# Patient Record
Sex: Female | Born: 1935 | Race: White | Hispanic: No | Smoking: Never smoker
Health system: Southern US, Community
[De-identification: ages and names within clinical notes are randomized; demographics above are authoritative.]

## PROBLEM LIST (undated history)

## (undated) DIAGNOSIS — E119 Type 2 diabetes mellitus without complications: Secondary | ICD-10-CM

## (undated) DIAGNOSIS — I639 Cerebral infarction, unspecified: Secondary | ICD-10-CM

## (undated) DIAGNOSIS — M1990 Unspecified osteoarthritis, unspecified site: Secondary | ICD-10-CM

## (undated) HISTORY — PX: CHOLECYSTECTOMY: SHX55

## (undated) HISTORY — PX: ABDOMINAL HYSTERECTOMY: SHX81

---

## 2014-07-26 ENCOUNTER — Emergency Department (HOSPITAL_BASED_OUTPATIENT_CLINIC_OR_DEPARTMENT_OTHER)
Admission: EM | Admit: 2014-07-26 | Discharge: 2014-07-26 | Disposition: A | Discharge status: Home / Self Care | Payer: Medicare Other | Attending: Emergency Medicine | Admitting: Emergency Medicine

## 2014-07-26 ENCOUNTER — Encounter (HOSPITAL_BASED_OUTPATIENT_CLINIC_OR_DEPARTMENT_OTHER): Payer: Self-pay | Admitting: *Deleted

## 2014-07-26 ENCOUNTER — Emergency Department (HOSPITAL_BASED_OUTPATIENT_CLINIC_OR_DEPARTMENT_OTHER): Payer: Medicare Other

## 2014-07-26 DIAGNOSIS — R42 Dizziness and giddiness: Secondary | ICD-10-CM | POA: Diagnosis not present

## 2014-07-26 DIAGNOSIS — Z7902 Long term (current) use of antithrombotics/antiplatelets: Secondary | ICD-10-CM | POA: Diagnosis not present

## 2014-07-26 DIAGNOSIS — R5383 Other fatigue: Secondary | ICD-10-CM | POA: Diagnosis not present

## 2014-07-26 DIAGNOSIS — R410 Disorientation, unspecified: Secondary | ICD-10-CM | POA: Diagnosis not present

## 2014-07-26 DIAGNOSIS — R531 Weakness: Secondary | ICD-10-CM | POA: Diagnosis not present

## 2014-07-26 DIAGNOSIS — R112 Nausea with vomiting, unspecified: Secondary | ICD-10-CM | POA: Diagnosis not present

## 2014-07-26 DIAGNOSIS — R51 Headache: Secondary | ICD-10-CM | POA: Diagnosis not present

## 2014-07-26 DIAGNOSIS — R739 Hyperglycemia, unspecified: Secondary | ICD-10-CM | POA: Insufficient documentation

## 2014-07-26 DIAGNOSIS — R519 Headache, unspecified: Secondary | ICD-10-CM

## 2014-07-26 DIAGNOSIS — Z79899 Other long term (current) drug therapy: Secondary | ICD-10-CM | POA: Insufficient documentation

## 2014-07-26 HISTORY — DX: Unspecified osteoarthritis, unspecified site: M19.90

## 2014-07-26 LAB — URINALYSIS, ROUTINE W REFLEX MICROSCOPIC
Bilirubin Urine: NEGATIVE
Glucose, UA: 100 mg/dL — AB
HGB URINE DIPSTICK: NEGATIVE
KETONES UR: NEGATIVE mg/dL
LEUKOCYTES UA: NEGATIVE
NITRITE: NEGATIVE
PROTEIN: NEGATIVE mg/dL
Specific Gravity, Urine: 1.016 (ref 1.005–1.030)
Urobilinogen, UA: 0.2 mg/dL (ref 0.0–1.0)
pH: 5 (ref 5.0–8.0)

## 2014-07-26 LAB — CBC WITH DIFFERENTIAL/PLATELET
BASOS ABS: 0 10*3/uL (ref 0.0–0.1)
BASOS PCT: 0 % (ref 0–1)
EOS ABS: 0.1 10*3/uL (ref 0.0–0.7)
Eosinophils Relative: 1 % (ref 0–5)
HEMATOCRIT: 40.8 % (ref 36.0–46.0)
HEMOGLOBIN: 13.7 g/dL (ref 12.0–15.0)
Lymphocytes Relative: 28 % (ref 12–46)
Lymphs Abs: 2.6 10*3/uL (ref 0.7–4.0)
MCH: 29.5 pg (ref 26.0–34.0)
MCHC: 33.6 g/dL (ref 30.0–36.0)
MCV: 87.7 fL (ref 78.0–100.0)
MONO ABS: 0.6 10*3/uL (ref 0.1–1.0)
MONOS PCT: 7 % (ref 3–12)
Neutro Abs: 5.9 10*3/uL (ref 1.7–7.7)
Neutrophils Relative %: 64 % (ref 43–77)
Platelets: 223 10*3/uL (ref 150–400)
RBC: 4.65 MIL/uL (ref 3.87–5.11)
RDW: 12.7 % (ref 11.5–15.5)
WBC: 9.2 10*3/uL (ref 4.0–10.5)

## 2014-07-26 LAB — BASIC METABOLIC PANEL
Anion gap: 5 (ref 5–15)
BUN: 14 mg/dL (ref 6–23)
CO2: 28 mmol/L (ref 19–32)
Calcium: 8.6 mg/dL (ref 8.4–10.5)
Chloride: 99 mEq/L (ref 96–112)
Creatinine, Ser: 0.59 mg/dL (ref 0.50–1.10)
GFR calc Af Amer: >90 mL/min (ref >90)
GFR, EST NON AFRICAN AMERICAN: 80 mL/min — AB (ref >90)
Glucose, Bld: 208 mg/dL — ABNORMAL HIGH (ref 70–99)
Potassium: 3.8 mmol/L (ref 3.5–5.1)
Sodium: 132 mmol/L — ABNORMAL LOW (ref 135–145)

## 2014-07-26 MED ORDER — SODIUM CHLORIDE 0.9 % IV BOLUS (SEPSIS)
500.0000 mL | Freq: Once | INTRAVENOUS | Status: AC
  Administered 2014-07-26: 500 mL via INTRAVENOUS

## 2014-07-26 MED ORDER — FENTANYL CITRATE 0.05 MG/ML IJ SOLN: 50.0000 ug | Freq: Once | INTRAMUSCULAR | Status: DC

## 2014-07-26 MED ORDER — CLOPIDOGREL BISULFATE 75 MG PO TABS: 75.0000 mg | ORAL_TABLET | Freq: Every day | ORAL | Status: AC

## 2014-07-26 MED ORDER — METOCLOPRAMIDE HCL 5 MG/ML IJ SOLN
10.0000 mg | Freq: Once | INTRAMUSCULAR | Status: AC
  Administered 2014-07-26: 10 mg via INTRAVENOUS
  Filled 2014-07-26: qty 2

## 2014-07-26 NOTE — Discharge Instructions (Signed)
You were seen for a headache.  Your Head CT scan was negative.  In consultation with a neurologist, since your symptoms have been for about 4-5 days, it is not felt that you need to be transferred to another hospital to have a stroke evaluation today.  However, the slurred speech and confusion that you had 5 days ago may have represented a mini-stroke or TIA and you should have further evaluation by your primary care physician.  The neurologist is recommending starting plavix until you can have this done.  You blood sugar was also high and needs to be followed by your primary care physician.

## 2014-07-26 NOTE — ED Provider Notes (Signed)
CSN: [id number]     Arrival date & time 07/26/14  1215 History   First MD Initiated Contact with Patient 07/26/14 1237     Chief Complaint  Patient presents with  . Headache     (Consider location/radiation/quality/duration/timing/severity/associated sxs/prior Treatment) HPI Comments: Patient presents with a headache. She does have a history of migraines but she says this headache feels completely different than her normal migraines. Of note, patient speaks [place] and her daughter is acting as Equities trader. She states that on Sunday, 4 days ago she had a sudden onset of intense headache to the top of her head. She states she has associated nausea and dry heaves. Her daughter states that she was a bit confused and had some slurred speech at that time. She's been taking some Tylenol intermittently and the pain is been waxing and waning since Sunday. This point she only has a mild headache to the top of her head. She states that the pain is different in location and intensity as compared to her normal migraines. She currently denies any neck pain. There is no fevers. She hasn't had any slurred speech since that time. She has no ongoing confusion. She has some baseline dizziness and balance problems but is unchanged from her baseline. She denies any numbness or weakness in her extremities. She denies any vision changes other than some flashing lights. She denies any fevers coughs recent illnesses. She denies any vertigo-type symptoms. She denies any recent head trauma. She's been taking Tylenol with some relief in symptoms. She states that she has frequent almost daily migrainous type headaches but her current headache feels different than her normal migraine-type headaches. She denies any neck or back pain.  Patient is a 81 y.o. female presenting with headaches.  Headache Associated symptoms: dizziness (at baseline), fatigue, nausea and vomiting   Associated symptoms: no abdominal pain, no back pain,  no congestion, no cough, no diarrhea, no fever and no numbness     Past Medical History  Diagnosis Date  . Arthritis    Past Surgical History  Procedure Laterality Date  . Cholecystectomy    . Abdominal hysterectomy     No family history on file. History  Substance Use Topics  . Smoking status: Never Smoker   . Smokeless tobacco: Never Used  . Alcohol Use: Yes     Comment: occasional wine   OB History    No data available     Review of Systems  Constitutional: Positive for fatigue. Negative for fever, chills and diaphoresis.  HENT: Negative for congestion, rhinorrhea and sneezing.   Eyes: Negative.   Respiratory: Negative for cough, chest tightness and shortness of breath.   Cardiovascular: Negative for chest pain and leg swelling.  Gastrointestinal: Positive for nausea and vomiting. Negative for abdominal pain, diarrhea and blood in stool.  Genitourinary: Negative for frequency, hematuria, flank pain and difficulty urinating.  Musculoskeletal: Negative for back pain and arthralgias.  Skin: Negative for rash.  Neurological: Positive for dizziness (at baseline), speech difficulty, weakness (Generalized weakness) and headaches. Negative for numbness.  Psychiatric/Behavioral: Positive for confusion.      Allergies  Aspirin  Home Medications   Prior to Admission medications   Medication Sig Start Date End Date Taking? Authorizing Provider  acetaminophen (TYLENOL) 325 MG tablet Take 650 mg by mouth every 6 (six) hours as needed.   Yes Historical Provider, MD  esomeprazole (NEXIUM) 20 MG capsule Take 20 mg by mouth daily at 12 noon.   Yes Historical Provider,  MD  Multiple Vitamin (MULTIVITAMIN) capsule Take 1 capsule by mouth daily.   Yes Historical Provider, MD  clopidogrel (PLAVIX) 75 MG tablet Take 1 tablet (75 mg total) by mouth daily. 07/26/14   Rolan Bucco, MD   BP 170/84 mmHg  Pulse 89  Temp(Src) 97.4 F (36.3 C) (Oral)  Resp 16  Ht  (1.575 m)  Wt 209  lb (94.802 kg)  BMI 38.22 kg/m2  SpO2 94% Physical Exam  Constitutional: She is oriented to person, place, and time. She appears well-developed and well-nourished.  HENT:  Head: Normocephalic and atraumatic.  Eyes: Pupils are equal, round, and reactive to light.  No nystagmus  Neck: Normal range of motion. Neck supple.  Cardiovascular: Normal rate, regular rhythm and normal heart sounds.   Pulmonary/Chest: Effort normal and breath sounds normal. No respiratory distress. She has no wheezes. She has no rales. She exhibits no tenderness.  Abdominal: Soft. Bowel sounds are normal. There is no tenderness. There is no rebound and no guarding.  Musculoskeletal: Normal range of motion. She exhibits no edema.  Lymphadenopathy:    She has no cervical adenopathy.  Neurological: She is alert and oriented to person, place, and time.  Motor 5 out of 5 upper extremities, or out of 5 lower extremities, sensation grossly intact to light touch all extremities, no pronator drift, finger to nose intact. Cranial nerves II through XII grossly intact  Skin: Skin is warm and dry. No rash noted.  Psychiatric: She has a normal mood and affect.    ED Course  Procedures (including critical care time) Labs Review Labs Reviewed  BASIC METABOLIC PANEL - Abnormal; Notable for the following:    Sodium 132 (*)    Glucose, Bld 208 (*)    GFR calc non Af Amer 80 (*)    All other components within normal limits  URINALYSIS, ROUTINE W REFLEX MICROSCOPIC - Abnormal; Notable for the following:    Glucose, UA 100 (*)    All other components within normal limits  CBC WITH DIFFERENTIAL    Imaging Review Ct Head Wo Contrast  07/26/2014   CLINICAL DATA:  Headache with some slurred speech and dizziness.  EXAM: CT HEAD WITHOUT CONTRAST  TECHNIQUE: Contiguous axial images were obtained from the base of the skull through the vertex without intravenous contrast.  COMPARISON:  None.  FINDINGS: There is mild small vessel disease  in the periventricular white matter. The brain demonstrates no evidence of hemorrhage, infarction, edema, mass effect, extra-axial fluid collection, hydrocephalus or mass lesion. The skull is unremarkable.  IMPRESSION: Mild small vessel ischemic changes in the periventricular white matter. No acute findings.   Electronically Signed   By: Irish Lack M.D.   On: 07/26/2014 13:35     EKG Interpretation None      MDM   Final diagnoses:  Headache, unspecified headache type  Hyperglycemia    Patient presents with a headache. She states it felt different than her normal migraine-type headaches but it's been going on for about 5 days. It's been off and on since that time. Her headache today's actually not as intense. She had some confusion and slurred speech 5 days ago and started but she has not had any other neurologic symptoms since that time. She's angulated in the ED and her gait is at baseline. She has no neurologic deficits. Her head CT is negative. She has no other symptoms that would be more suggestive of subarachnoid hemorrhage. I consult with Dr. Thad Ranger with neurology.  She felt that given that her symptoms of confusion and slurred speech for 5 days ago that she didn't need emergent transfer for an MRI and stroke workup. We will go ahead and start her on Plavix in the event that she did have a possible TIA although I feel this is less likely. I discussed with the patient and her daughter that she needs close follow-up with her primary care physician. She is going back to [place] on Sunday and has an upcoming appointment with her primary care physician on February 10 but is going to try to change it to make an earlier appointment. I gave her prescription for Plavix. I advised her in bleeding precautions. I also advised her that her blood sugar was high and that she'll need outpatient follow-up for this as well. I gave her discharge instructions on diabetic diet choices. I advised if she  has any worsening symptoms before she goes home to return here immediately to the emergency department.    Rolan BuccoMelanie Nyx Keady, MD 07/26/14 [phone]

## 2014-07-26 NOTE — ED Notes (Signed)
MD at bedside. 

## 2014-07-26 NOTE — ED Notes (Signed)
Patient transported to CT 

## 2014-07-26 NOTE — ED Notes (Signed)
Pt reports she feels dizzy when changing position or walking but this her baseline- the dizziness is not worse than what she normally experiences. Pt reports she is feeling better. Declined pain med at this time. Dr Fredderick PhenixBelfi made aware

## 2014-07-26 NOTE — ED Notes (Signed)
Pt c/o headache Sunday which was relieved with tylenol- headache worse since last night with blurred vision- vomited x 1 yesterday- grips strong and equal, facial symmetry present, no drift

## 2015-01-25 ENCOUNTER — Emergency Department: Payer: Medicare Other

## 2015-01-25 ENCOUNTER — Encounter: Payer: Self-pay | Admitting: Emergency Medicine

## 2015-01-25 ENCOUNTER — Inpatient Hospital Stay
Admission: EM | Admit: 2015-01-25 | Discharge: 2015-01-30 | DRG: 640 | Disposition: A | Discharge status: Home / Self Care | Payer: Medicare Other | Attending: Internal Medicine | Admitting: Internal Medicine

## 2015-01-25 DIAGNOSIS — Z7902 Long term (current) use of antithrombotics/antiplatelets: Secondary | ICD-10-CM

## 2015-01-25 DIAGNOSIS — I951 Orthostatic hypotension: Secondary | ICD-10-CM | POA: Diagnosis present

## 2015-01-25 DIAGNOSIS — E785 Hyperlipidemia, unspecified: Secondary | ICD-10-CM | POA: Diagnosis present

## 2015-01-25 DIAGNOSIS — G9341 Metabolic encephalopathy: Secondary | ICD-10-CM | POA: Diagnosis present

## 2015-01-25 DIAGNOSIS — Z886 Allergy status to analgesic agent status: Secondary | ICD-10-CM

## 2015-01-25 DIAGNOSIS — E871 Hypo-osmolality and hyponatremia: Principal | ICD-10-CM | POA: Diagnosis present

## 2015-01-25 DIAGNOSIS — I6330 Cerebral infarction due to thrombosis of unspecified cerebral artery: Secondary | ICD-10-CM | POA: Diagnosis present

## 2015-01-25 DIAGNOSIS — I639 Cerebral infarction, unspecified: Secondary | ICD-10-CM

## 2015-01-25 DIAGNOSIS — E860 Dehydration: Secondary | ICD-10-CM | POA: Diagnosis present

## 2015-01-25 DIAGNOSIS — K5900 Constipation, unspecified: Secondary | ICD-10-CM | POA: Diagnosis present

## 2015-01-25 DIAGNOSIS — M1990 Unspecified osteoarthritis, unspecified site: Secondary | ICD-10-CM | POA: Diagnosis present

## 2015-01-25 DIAGNOSIS — R4182 Altered mental status, unspecified: Secondary | ICD-10-CM

## 2015-01-25 DIAGNOSIS — R410 Disorientation, unspecified: Secondary | ICD-10-CM

## 2015-01-25 LAB — BASIC METABOLIC PANEL
ANION GAP: 9 (ref 5–15)
BUN: 14 mg/dL (ref 6–20)
CHLORIDE: 94 mmol/L — AB (ref 101–111)
CO2: 26 mmol/L (ref 22–32)
CREATININE: 0.62 mg/dL (ref 0.44–1.00)
Calcium: 8.2 mg/dL — ABNORMAL LOW (ref 8.9–10.3)
GFR calc non Af Amer: >60 mL/min (ref >60)
Glucose, Bld: 130 mg/dL — ABNORMAL HIGH (ref 65–99)
Potassium: 3.9 mmol/L (ref 3.5–5.1)
Sodium: 129 mmol/L — ABNORMAL LOW (ref 135–145)

## 2015-01-25 LAB — URINALYSIS COMPLETE WITH MICROSCOPIC (ARMC ONLY)
BILIRUBIN URINE: NEGATIVE
Bacteria, UA: NONE SEEN
Glucose, UA: 50 mg/dL — AB
HGB URINE DIPSTICK: NEGATIVE
Leukocytes, UA: NEGATIVE
Nitrite: NEGATIVE
PH: 6 (ref 5.0–8.0)
Protein, ur: NEGATIVE mg/dL
SPECIFIC GRAVITY, URINE: 1.025 (ref 1.005–1.030)

## 2015-01-25 LAB — CBC
HCT: 42.6 % (ref 35.0–47.0)
Hemoglobin: 14.7 g/dL (ref 12.0–16.0)
MCH: 28.8 pg (ref 26.0–34.0)
MCHC: 34.4 g/dL (ref 32.0–36.0)
MCV: 83.7 fL (ref 80.0–100.0)
PLATELETS: 231 10*3/uL (ref 150–440)
RBC: 5.09 MIL/uL (ref 3.80–5.20)
RDW: 13.1 % (ref 11.5–14.5)
WBC: 10.6 10*3/uL (ref 3.6–11.0)

## 2015-01-25 LAB — COMPREHENSIVE METABOLIC PANEL
ALK PHOS: 62 U/L (ref 38–126)
ALT: 11 U/L — ABNORMAL LOW (ref 14–54)
AST: 21 U/L (ref 15–41)
Albumin: 3.7 g/dL (ref 3.5–5.0)
Anion gap: 10 (ref 5–15)
BUN: 16 mg/dL (ref 6–20)
CO2: 24 mmol/L (ref 22–32)
CREATININE: 0.63 mg/dL (ref 0.44–1.00)
Calcium: 8.1 mg/dL — ABNORMAL LOW (ref 8.9–10.3)
Chloride: 89 mmol/L — ABNORMAL LOW (ref 101–111)
GFR calc Af Amer: >60 mL/min (ref >60)
GLUCOSE: 160 mg/dL — AB (ref 65–99)
POTASSIUM: 3.9 mmol/L (ref 3.5–5.1)
Sodium: 123 mmol/L — ABNORMAL LOW (ref 135–145)
Total Bilirubin: 1.1 mg/dL (ref 0.3–1.2)
Total Protein: 7.4 g/dL (ref 6.5–8.1)

## 2015-01-25 LAB — TROPONIN I

## 2015-01-25 LAB — LIPASE, BLOOD: LIPASE: 10 U/L — AB (ref 22–51)

## 2015-01-25 MED ORDER — ONDANSETRON HCL 4 MG/2ML IJ SOLN: 4.0000 mg | Freq: Four times a day (QID) | INTRAMUSCULAR | Status: DC | PRN

## 2015-01-25 MED ORDER — OXYCODONE-ACETAMINOPHEN 5-325 MG PO TABS
1.0000 | ORAL_TABLET | Freq: Once | ORAL | Status: AC
  Administered 2015-01-25: 1 via ORAL
  Filled 2015-01-25: qty 1

## 2015-01-25 MED ORDER — BISACODYL 5 MG PO TBEC
5.0000 mg | DELAYED_RELEASE_TABLET | Freq: Every day | ORAL | Status: DC | PRN
  Administered 2015-01-29 – 2015-01-30 (×2): 5 mg via ORAL
  Filled 2015-01-25 (×2): qty 1

## 2015-01-25 MED ORDER — PANTOPRAZOLE SODIUM 40 MG PO TBEC: 40.0000 mg | DELAYED_RELEASE_TABLET | Freq: Every day | ORAL | Status: DC

## 2015-01-25 MED ORDER — SODIUM CHLORIDE 0.9 % IV SOLN: 1000.0000 mL | Freq: Once | INTRAVENOUS | Status: DC

## 2015-01-25 MED ORDER — FLUTICASONE PROPIONATE HFA 110 MCG/ACT IN AERO
2.0000 | INHALATION_SPRAY | Freq: Two times a day (BID) | RESPIRATORY_TRACT | Status: DC
Start: 2015-01-25 — End: 2015-01-25

## 2015-01-25 MED ORDER — PANTOPRAZOLE SODIUM 40 MG PO TBEC
40.0000 mg | DELAYED_RELEASE_TABLET | Freq: Every day | ORAL | Status: DC
  Administered 2015-01-25 – 2015-01-30 (×6): 40 mg via ORAL
  Filled 2015-01-25 (×6): qty 1

## 2015-01-25 MED ORDER — ENOXAPARIN SODIUM 40 MG/0.4ML ~~LOC~~ SOLN
40.0000 mg | SUBCUTANEOUS | Status: DC
  Administered 2015-01-25 – 2015-01-26 (×2): 40 mg via SUBCUTANEOUS
  Filled 2015-01-25 (×2): qty 0.4

## 2015-01-25 MED ORDER — VITAMIN B-12 1000 MCG PO TABS
1000.0000 ug | ORAL_TABLET | Freq: Every day | ORAL | Status: DC
  Administered 2015-01-25 – 2015-01-30 (×6): 1000 ug via ORAL
  Filled 2015-01-25 (×6): qty 1

## 2015-01-25 MED ORDER — ALBUTEROL SULFATE HFA 108 (90 BASE) MCG/ACT IN AERS: 2.0000 | INHALATION_SPRAY | Freq: Four times a day (QID) | RESPIRATORY_TRACT | Status: DC | PRN

## 2015-01-25 MED ORDER — DOCUSATE SODIUM 100 MG PO CAPS
100.0000 mg | ORAL_CAPSULE | Freq: Two times a day (BID) | ORAL | Status: DC
  Administered 2015-01-25 – 2015-01-30 (×9): 100 mg via ORAL
  Filled 2015-01-25 (×10): qty 1

## 2015-01-25 MED ORDER — BUDESONIDE 0.5 MG/2ML IN SUSP
1.0000 mg | Freq: Two times a day (BID) | RESPIRATORY_TRACT | Status: DC
  Administered 2015-01-25: 1 mg via RESPIRATORY_TRACT
  Administered 2015-01-26 – 2015-01-28 (×3): 0.5 mg via RESPIRATORY_TRACT
  Filled 2015-01-25 (×6): qty 4

## 2015-01-25 MED ORDER — ONDANSETRON HCL 4 MG PO TABS: 4.0000 mg | ORAL_TABLET | Freq: Four times a day (QID) | ORAL | Status: DC | PRN

## 2015-01-25 MED ORDER — ALBUTEROL SULFATE (2.5 MG/3ML) 0.083% IN NEBU: 2.5000 mg | INHALATION_SOLUTION | RESPIRATORY_TRACT | Status: DC | PRN

## 2015-01-25 MED ORDER — SODIUM CHLORIDE 0.9 % IV SOLN
INTRAVENOUS | Status: AC
  Administered 2015-01-25: 17:00:00 via INTRAVENOUS

## 2015-01-25 MED ORDER — CLOPIDOGREL BISULFATE 75 MG PO TABS
75.0000 mg | ORAL_TABLET | Freq: Every day | ORAL | Status: DC
  Administered 2015-01-25 – 2015-01-26 (×2): 75 mg via ORAL
  Filled 2015-01-25 (×2): qty 1

## 2015-01-25 MED ORDER — METOPROLOL TARTRATE 25 MG PO TABS
25.0000 mg | ORAL_TABLET | Freq: Two times a day (BID) | ORAL | Status: DC
  Administered 2015-01-25 – 2015-01-29 (×6): 25 mg via ORAL
  Filled 2015-01-25 (×7): qty 1

## 2015-01-25 MED ORDER — ACETAMINOPHEN 325 MG PO TABS
650.0000 mg | ORAL_TABLET | Freq: Four times a day (QID) | ORAL | Status: DC | PRN
  Administered 2015-01-26 – 2015-01-28 (×7): 650 mg via ORAL
  Administered 2015-01-29: 325 mg via ORAL
  Filled 2015-01-25 (×3): qty 2
  Filled 2015-01-25: qty 1
  Filled 2015-01-25 (×5): qty 2

## 2015-01-25 NOTE — H&P (Signed)
[facility] [place] [facility]   PATIENT NAME: Jo Moreno    MR#:  [id number]  DATE OF BIRTH:  01/31/1925  DATE OF ADMISSION:  01/25/2015  PRIMARY CARE PHYSICIAN: No primary care provider on file.   REQUESTING/REFERRING PHYSICIAN: Dr. Cyril Loosen  CHIEF COMPLAINT:  Weakness and confusion  HISTORY OF PRESENT ILLNESS:  Jo Moreno  is a 81 y.o. female who speaks [place] with a known history of osteoarthritis and no other significant medical history is brought into the ED with a chief complaint of generalized weakness and altered mental status by her daughter. According to the patient's granddaughter at bedside and patient was outdoors in the hot sun on last Tuesday and was very dehydrated. From the next day onwards patient was complaining of abdominal pain and also has been having nausea and vomiting.  Patient's abdominal pain was resolved after she had a BM today. Patient with decreased by mouth intake as she was dry heaving. For the past one and half day the patient seemed to be confused. Patient's sodium is at 123 in the ED, CT head is negative for any acute abnormalities. She was given 1 L fluid bolus following which she was more awake and alert as reported by the granddaughter  PAST MEDICAL HISTORY:   Past Medical History  Diagnosis Date  . Arthritis     PAST SURGICAL HISTOIRY:   Past Surgical History  Procedure Laterality Date  . Cholecystectomy    . Abdominal hysterectomy      SOCIAL HISTORY:   History  Substance Use Topics  . Smoking status: Never Smoker   . Smokeless tobacco: Never Used  . Alcohol Use: Yes     Comment: occasional wine    FAMILY HISTORY:  History reviewed. No pertinent family history.  DRUG ALLERGIES:   Allergies  Allergen Reactions  . Aspirin Other (See Comments)    Stomach irritation    REVIEW OF SYSTEMS:  Unobtainable in view of altered mental status  MEDICATIONS AT HOME:   Prior to Admission medications    Medication Sig Start Date End Date Taking? Authorizing Provider  acetaminophen (TYLENOL) 325 MG tablet Take 650 mg by mouth every 6 (six) hours as needed.   Yes Historical Provider, MD  esomeprazole (NEXIUM) 40 MG capsule Take 40 mg by mouth daily at 12 noon.   Yes Historical Provider, MD  FLOVENT HFA 110 MCG/ACT inhaler Inhale 2 puffs into the lungs 2 (two) times daily.   Yes Historical Provider, MD  naproxen (NAPROSYN) 500 MG tablet Take 500 mg by mouth 2 (two) times daily with a meal.   Yes Historical Provider, MD  PROAIR HFA 108 (90 BASE) MCG/ACT inhaler Inhale 2 puffs into the lungs 4 (four) times daily as needed.   Yes Historical Provider, MD  vitamin B-12 (CYANOCOBALAMIN) 1000 MCG tablet Take 1,000 mcg by mouth daily.   Yes Historical Provider, MD  clopidogrel (PLAVIX) 75 MG tablet Take 1 tablet (75 mg total) by mouth daily. 07/26/14   Rolan Bucco, MD  Multiple Vitamin (MULTIVITAMIN) capsule Take 1 capsule by mouth daily.    Historical Provider, MD      VITAL SIGNS:  Blood pressure 194/81, pulse 59, temperature 98.7 F (37.1 C), temperature source Oral, resp. rate 20, height 5\' 2"  (1.575 m), weight 86.41 kg (190 lb 8 oz), SpO2 96 %.  PHYSICAL EXAMINATION:  GENERAL:  81 y.o.-year-old patient lying in the bed with no acute distress.  EYES: Pupils equal, round, reactive to  light and accommodation. No scleral icterus. Extraocular muscles intact.  HEENT: Head atraumatic, normocephalic. Oropharynx and nasopharynx clear. Dry mucous membranes NECK:  Supple, no jugular venous distention. No thyroid enlargement, no tenderness.  LUNGS: Normal breath sounds bilaterally, no wheezing, rales,rhonchi or crepitation. No use of accessory muscles of respiration.  CARDIOVASCULAR: S1, S2 normal. No murmurs, rubs, or gallops.  ABDOMEN: Soft, nontender, nondistended. Bowel sounds present. No organomegaly or mass.  EXTREMITIES: No pedal edema, cyanosis, or clubbing.  NEUROLOGIC:  Opens eyes to verbal  commands and smiles.  Gait not checked. Reflexes 2+ PSYCHIATRIC: The patient is lethargic  SKIN: No obvious rash, lesion, or ulcer.   LABORATORY PANEL:   CBC  Recent Labs Lab 01/25/15 1032  WBC 10.6  HGB 14.7  HCT 42.6  PLT 231   ------------------------------------------------------------------------------------------------------------------  Chemistries   Recent Labs Lab 01/25/15 1032  NA 123*  K 3.9  CL 89*  CO2 24  GLUCOSE 160*  BUN 16  CREATININE 0.63  CALCIUM 8.1*  AST 21  ALT 11*  ALKPHOS 62  BILITOT 1.1   ------------------------------------------------------------------------------------------------------------------  Cardiac Enzymes  Recent Labs Lab 01/25/15 1032  TROPONINI <0.03   ------------------------------------------------------------------------------------------------------------------  RADIOLOGY:  Ct Head Wo Contrast  01/25/2015   CLINICAL DATA:  Altered mental status for 1-2 days. Decreased appetite and weakness.  EXAM: CT HEAD WITHOUT CONTRAST  TECHNIQUE: Contiguous axial images were obtained from the base of the skull through the vertex without intravenous contrast.  COMPARISON:  Head CT dated 07/26/2014.  FINDINGS: There is mild generalized brain atrophy with commensurate dilatation of the ventricles and sulci. Chronic small vessel ischemic changes again noted within the bilateral periventricular and subcortical white matter regions.  There is no mass, hemorrhage, edema, or other evidence of acute parenchymal abnormality. No extra-axial hemorrhage. No osseous abnormality. Visualized upper paranasal sinuses are clear. Mastoid air cells are clear.  IMPRESSION: 1. Atrophy and chronic ischemic changes as detailed above. 2. No evidence of acute intracranial abnormality. No intracranial mass, hemorrhage, or edema.   Electronically Signed   By: Bary Richard M.D.   On: 01/25/2015 11:26   Dg Chest Portable 1 View  01/25/2015   CLINICAL DATA:   Constipation, decreased appetite, weakness. Patient also reports nausea and vomiting. Patient also states chest tightness and shortness of breath.  EXAM: PORTABLE CHEST - 1 VIEW  COMPARISON:  None.  FINDINGS: Heart is enlarged. Questionable mild bibasilar interstitial edema. Evaluation of the left lung base is limited by the overlying heart shadow. No large pleural effusion seen. Cannot exclude small left effusion at the costophrenic angle.  IMPRESSION: Cardiomegaly.  No convincing evidence of congestive heart failure.  Perhaps mild bibasilar interstitial edema. Lungs are otherwise clear. No evidence of pneumonia seen. Evaluation of the left lung base limited by the overlying heart shadow.   Electronically Signed   By: Bary Richard M.D.   On: 01/25/2015 11:20    EKG:  No orders found for this or any previous visit.  IMPRESSION AND PLAN:   Garrett Bowring  is a 81 y.o. female who speaks [place] with a known history of osteoarthritis and no other significant medical history is brought into the ED with a chief complaint of generalized weakness and altered mental status by her daughter. According to the patient's granddaughter at bedside and patient was outdoors in the hot sun on last Tuesday and was very dehydrated. From the next day onwards patient was complaining of abdominal pain and also has been having nausea and vomiting.  1. Altered mental status secondary to dehydration and hyponatremia  Admit her to telemetry Neuro checks Hydration with IV fluids Monitor serial sodiums and titrate IV fluids. Will avoid over jealous correction of sodium in view of altered mental status Which could precipitate central pontine myelo necrosis Nephrology consult is placed   2. Hyponatremia secondary to poor by mouth intake and nausea and vomiting Hydration with IV fluids Will provide Protonix and antiemetics   3. Constipation Laxatives as needed basis   4. Osteoarthritis Pain management as  needed  Provide GI and DVT prophylaxis All the records are reviewed and case discussed with ED provider. Management plans discussed with the patient, family and they are in agreement.  CODE STATUS: Full code, daughter is the healthcare power of attorney  TOTAL TIME TAKING CARE OF THIS PATIENT, reviewing medical records, coordination of care, history and physical and admission orders - 45 minutes.    Ramonita Lab M.D on 01/25/2015 at 3:11 PM  Between 7am to 6pm - Pager - [phone]  After 6pm go to [email or web address] - password [email or web address] [facility]  [facility]  Office  [phone]  CC: Primary care physician; No primary care provider on file.

## 2015-01-25 NOTE — Progress Notes (Signed)
Pt was transferred from the ED. Interpreter was paged and used during initial assessment and answering questions to profile. Pt daughter and granddaughter at bedside. Pt BP elevated. Dr. Amado Coe notified. Orders received for Metoprolol.  Primary nurse to continue to monitor

## 2015-01-25 NOTE — ED Provider Notes (Signed)
[facility] Emergency Department Provider Note  ____________________________________________  Time seen: On arrival  I have reviewed the triage vital signs and the nursing notes.   HISTORY  Chief Complaint Weakness and Constipation  History per daughter  HPI Jo Moreno is a 81 y.o. female who presents with weakness, altered mental status for 24-48 hours. Daughter reports that patient was doing well prior to her leaving for the beach but when she got back patient does not normal. She is confused and sluggish. Patient is able to answer questions and denies fevers or chills. No neuro deficits noted. She denies dysuria. Mild cough.     Past Medical History  Diagnosis Date  . Arthritis     There are no active problems to display for this patient.   Past Surgical History  Procedure Laterality Date  . Cholecystectomy    . Abdominal hysterectomy      Current Outpatient Rx  Name  Route  Sig  Dispense  Refill  . acetaminophen (TYLENOL) 325 MG tablet   Oral   Take 650 mg by mouth every 6 (six) hours as needed.         Marland Kitchen esomeprazole (NEXIUM) 40 MG capsule   Oral   Take 40 mg by mouth daily at 12 noon.         Marland Kitchen FLOVENT HFA 110 MCG/ACT inhaler   Inhalation   Inhale 2 puffs into the lungs 2 (two) times daily.      0     Dispense as written.   . naproxen (NAPROSYN) 500 MG tablet   Oral   Take 500 mg by mouth 2 (two) times daily with a meal.         . PROAIR HFA 108 (90 BASE) MCG/ACT inhaler   Inhalation   Inhale 2 puffs into the lungs 4 (four) times daily as needed.      0     Dispense as written.   . vitamin B-12 (CYANOCOBALAMIN) 1000 MCG tablet   Oral   Take 1,000 mcg by mouth daily.         . clopidogrel (PLAVIX) 75 MG tablet   Oral   Take 1 tablet (75 mg total) by mouth daily.   30 tablet   0   . Multiple Vitamin (MULTIVITAMIN) capsule   Oral   Take 1 capsule by mouth daily.            Allergies Aspirin  History reviewed. No pertinent family history.  Social History History  Substance Use Topics  . Smoking status: Never Smoker   . Smokeless tobacco: Never Used  . Alcohol Use: Yes     Comment: occasional wine    Review of Systems per daughter  Constitutional: Negative for fever. Eyes: Negative for discharge ENT: Negative for difficulty swallowing Cardiovascular: Negative for chest pain. Respiratory: Negative for shortness of breath. Gastrointestinal: Positive for constipation and abdominal discomfort Genitourinary: Negative for dysuria. Musculoskeletal: Negative for back pain. Skin: Negative for rash. Neurological: Negative for headaches or focal weakness Psychiatric: No anxiety  10-point ROS otherwise negative.  ____________________________________________   PHYSICAL EXAM:  VITAL SIGNS: ED Triage Vitals  Enc Vitals Group     BP 01/25/15 1008 157/75 mmHg     Pulse Rate 01/25/15 1008 70     Resp 01/25/15 1008 18     Temp 01/25/15 1008 99.1 F (37.3 C)     Temp Source 01/25/15 1008 Oral     SpO2 01/25/15 1008 97 %  Weight 01/25/15 1008 180 lb (81.647 kg)     Height 01/25/15 1008 5\' 2"  (1.575 m)     Head Cir --      Peak Flow --      Pain Score 01/25/15 1009 10     Pain Loc --      Pain Edu? --      Excl. in GC? --      Constitutional: Alert but confused. Well appearing Eyes: Conjunctivae are normal.  ENT   Head: Normocephalic and atraumatic.   Mouth/Throat: Mucous membranes are moist. Cardiovascular: Normal rate, regular rhythm. Normal and symmetric distal pulses are present in all extremities. No murmurs, rubs, or gallops. Respiratory: Normal respiratory effort without tachypnea nor retractions. Breath sounds are clear and equal bilaterally.  Gastrointestinal: Mild tenderness to palpation significantly. No distention. There is no CVA tenderness. Genitourinary: deferred Musculoskeletal: Nontender with normal range of  motion in all extremities. No lower extremity tenderness nor edema. Neurologic:  Normal speech and language. No gross focal neurologic deficits are appreciated. Skin:  Skin is warm, dry and intact. No rash noted. Psychiatric: Mood and affect are normal. Patient exhibits appropriate insight and judgment.  ____________________________________________    LABS (pertinent positives/negatives)  Labs Reviewed  LIPASE, BLOOD - Abnormal; Notable for the following:    Lipase 10 (*)    All other components within normal limits  COMPREHENSIVE METABOLIC PANEL - Abnormal; Notable for the following:    Sodium 123 (*)    Chloride 89 (*)    Glucose, Bld 160 (*)    Calcium 8.1 (*)    ALT 11 (*)    All other components within normal limits  URINALYSIS COMPLETEWITH MICROSCOPIC ([facility] ONLY) - Abnormal; Notable for the following:    Color, Urine YELLOW (*)    APPearance CLEAR (*)    Glucose, UA 50 (*)    Ketones, ur 2+ (*)    Squamous Epithelial / LPF 0-5 (*)    All other components within normal limits  CULTURE, BLOOD (ROUTINE X 2)  CULTURE, BLOOD (ROUTINE X 2)  URINE CULTURE  CBC  TROPONIN I    ____________________________________________   EKG  ED ECG REPORT I, Jene Every, the attending physician, personally viewed and interpreted this ECG.   Date: 01/25/2015  EKG Time: 10:26 AM  Rate: 72  Rhythm: normal sinus rhythm  Axis: Left axis deviation  Intervals:none  ST&T Change: Normal   ____________________________________________    RADIOLOGY I have personally reviewed any xrays that were ordered on this patient: Chest x-ray unremarkable  ____________________________________________   PROCEDURES  Procedure(s) performed: none  Critical Care performed: yes CRITICAL CARE Performed by: Jene Every   Total critical care time: 30  Critical care time was exclusive of separately billable procedures and treating other patients.  Critical care was necessary to treat  or prevent imminent or life-threatening deterioration.  Critical care was time spent personally by me on the following activities: development of treatment plan with patient and/or surrogate as well as nursing, discussions with consultants, evaluation of patient's response to treatment, examination of patient, obtaining history from patient or surrogate, ordering and performing treatments and interventions, ordering and review of laboratory studies, ordering and review of radiographic studies, pulse oximetry and re-evaluation of patient's condition.   ____________________________________________   INITIAL IMPRESSION / ASSESSMENT AND PLAN / ED COURSE  Pertinent labs & imaging results that were available during my care of the patient were reviewed by me and considered in my medical decision making (see  chart for details).  Patient with sodium of 123 which is a significant change from prior. She does seem to have some mild confusion this is made difficult by language barrier. No evidence of infection at this time. We will admit for further workup and IV fluids  ____________________________________________   FINAL CLINICAL IMPRESSION(S) / ED DIAGNOSES  Final diagnoses:  Acute hyponatremia  Altered mental status, unspecified altered mental status type     Jene Every, MD 01/25/15 1250

## 2015-01-25 NOTE — ED Notes (Signed)
Pt to ed with family who reports pt has had constipation, decreased appetite, and weakness.  Pt also reports nausea and vomiting.

## 2015-01-26 ENCOUNTER — Inpatient Hospital Stay: Payer: Medicare Other

## 2015-01-26 LAB — CBC
HCT: 38.6 % (ref 35.0–47.0)
Hemoglobin: 13.2 g/dL (ref 12.0–16.0)
MCH: 29 pg (ref 26.0–34.0)
MCHC: 34.2 g/dL (ref 32.0–36.0)
MCV: 84.6 fL (ref 80.0–100.0)
PLATELETS: 214 10*3/uL (ref 150–440)
RBC: 4.56 MIL/uL (ref 3.80–5.20)
RDW: 13 % (ref 11.5–14.5)
WBC: 9.6 10*3/uL (ref 3.6–11.0)

## 2015-01-26 LAB — BASIC METABOLIC PANEL
Anion gap: 8 (ref 5–15)
Anion gap: 7 (ref 5–15)
BUN: 13 mg/dL (ref 6–20)
BUN: 13 mg/dL (ref 6–20)
CO2: 25 mmol/L (ref 22–32)
CO2: 26 mmol/L (ref 22–32)
CREATININE: 0.59 mg/dL (ref 0.44–1.00)
Calcium: 7.9 mg/dL — ABNORMAL LOW (ref 8.9–10.3)
Calcium: 8.2 mg/dL — ABNORMAL LOW (ref 8.9–10.3)
Chloride: 97 mmol/L — ABNORMAL LOW (ref 101–111)
Chloride: 100 mmol/L — ABNORMAL LOW (ref 101–111)
Creatinine, Ser: 0.62 mg/dL (ref 0.44–1.00)
GFR calc Af Amer: >60 mL/min (ref >60)
GFR calc Af Amer: >60 mL/min (ref >60)
GFR calc non Af Amer: >60 mL/min (ref >60)
GFR calc non Af Amer: >60 mL/min (ref >60)
Glucose, Bld: 111 mg/dL — ABNORMAL HIGH (ref 65–99)
Glucose, Bld: 106 mg/dL — ABNORMAL HIGH (ref 65–99)
POTASSIUM: 3.5 mmol/L (ref 3.5–5.1)
Potassium: 3.6 mmol/L (ref 3.5–5.1)
Sodium: 130 mmol/L — ABNORMAL LOW (ref 135–145)
Sodium: 133 mmol/L — ABNORMAL LOW (ref 135–145)

## 2015-01-26 LAB — VITAMIN B12: Vitamin B-12: 553 pg/mL (ref 180–914)

## 2015-01-26 MED ORDER — GADOBENATE DIMEGLUMINE 529 MG/ML IV SOLN
20.0000 mL | Freq: Once | INTRAVENOUS | Status: AC | PRN
  Administered 2015-01-26: 18 mL via INTRAVENOUS

## 2015-01-26 MED ORDER — VITAMIN B-1 100 MG PO TABS
100.0000 mg | ORAL_TABLET | Freq: Every day | ORAL | Status: DC
  Administered 2015-01-26 – 2015-01-30 (×5): 100 mg via ORAL
  Filled 2015-01-26 (×5): qty 1

## 2015-01-26 NOTE — Progress Notes (Signed)
[facility] [place] [facility]   PATIENT NAME: Jo Moreno    MR#:  [id number]  DATE OF BIRTH:  [birth year]  SUBJECTIVE: 81 year old [place] female brought in by family secondary to confusion. According to the daughter patient is very functional and the cooks at home and walks without any help. Noted to have confusion for the past 2 days. Admitted for hyponatremia of sodium 123 , now improved with fluids to 1:30. But today she is still confused unable to assess questions appropriately. Doesn't know where she is. Afebrile.   CHIEF COMPLAINT:   Chief Complaint  Patient presents with  . Weakness  . Constipation    REVIEW OF SYSTEMS:   Review of Systems  Unable to perform ROS: medical condition    DRUG ALLERGIES:   Allergies  Allergen Reactions  . Aspirin Other (See Comments)    Stomach irritation  . Codeine     VITALS:  Blood pressure 145/69, pulse 64, temperature 98.6 F (37 C), temperature source Oral, resp. rate 22, height  (1.575 m), weight 86.41 kg (190 lb 8 oz), SpO2 97 %.  PHYSICAL EXAMINATION:  GENERAL:  81 y.o.-year-old patient lying in the bed with no acute distress.  EYES: Pupils equal, round, reactive to light and accommodation. No scleral icterus. Extraocular muscles intact.  HEENT: Head atraumatic, normocephalic. Oropharynx and nasopharynx clear.  NECK:  Supple, no jugular venous distention. No thyroid enlargement, no tenderness.  LUNGS: Normal breath sounds bilaterally, no wheezing, rales,rhonchi or crepitation. No use of accessory muscles of respiration.  CARDIOVASCULAR: S1, S2 normal. No murmurs, rubs, or gallops.  ABDOMEN: Soft, nontender, nondistended. Bowel sounds present. No organomegaly or mass.  EXTREMITIES: No pedal edema, cyanosis, or clubbing.  NEUROLOGIC: Unable to Do full neurological exam but no gross neurological deficit. PSYCHIATRIC: Disoriented SKIN: No obvious rash, lesion, or ulcer.    LABORATORY PANEL:    CBC  Recent Labs Lab 01/26/15 0325  WBC 9.6  HGB 13.2  HCT 38.6  PLT 214   ------------------------------------------------------------------------------------------------------------------  Chemistries   Recent Labs Lab 01/25/15 1032  01/26/15 0325  NA 123*  < > 130*  K 3.9  < > 3.5  CL 89*  < > 97*  CO2 24  < > 25  GLUCOSE 160*  < > 111*  BUN 16  < > 13  CREATININE 0.63  < > 0.59  CALCIUM 8.1*  < > 7.9*  AST 21  --   --   ALT 11*  --   --   ALKPHOS 62  --   --   BILITOT 1.1  --   --   < > = values in this interval not displayed. ------------------------------------------------------------------------------------------------------------------  Cardiac Enzymes  Recent Labs Lab 01/25/15 1032  TROPONINI <0.03   ------------------------------------------------------------------------------------------------------------------  RADIOLOGY:  Ct Head Wo Contrast  01/25/2015   CLINICAL DATA:  Altered mental status for 1-2 days. Decreased appetite and weakness.  EXAM: CT HEAD WITHOUT CONTRAST  TECHNIQUE: Contiguous axial images were obtained from the base of the skull through the vertex without intravenous contrast.  COMPARISON:  Head CT dated 07/26/2014.  FINDINGS: There is mild generalized brain atrophy with commensurate dilatation of the ventricles and sulci. Chronic small vessel ischemic changes again noted within the bilateral periventricular and subcortical white matter regions.  There is no mass, hemorrhage, edema, or other evidence of acute parenchymal abnormality. No extra-axial hemorrhage. No osseous abnormality. Visualized upper paranasal sinuses are clear. Mastoid air cells are clear.  IMPRESSION:  1. Atrophy and chronic ischemic changes as detailed above. 2. No evidence of acute intracranial abnormality. No intracranial mass, hemorrhage, or edema.   Electronically Signed   By: Bary Richard M.D.   On: 01/25/2015 11:26   Dg Chest Portable 1 View  01/25/2015    CLINICAL DATA:  Constipation, decreased appetite, weakness. Patient also reports nausea and vomiting. Patient also states chest tightness and shortness of breath.  EXAM: PORTABLE CHEST - 1 VIEW  COMPARISON:  None.  FINDINGS: Heart is enlarged. Questionable mild bibasilar interstitial edema. Evaluation of the left lung base is limited by the overlying heart shadow. No large pleural effusion seen. Cannot exclude small left effusion at the costophrenic angle.  IMPRESSION: Cardiomegaly.  No convincing evidence of congestive heart failure.  Perhaps mild bibasilar interstitial edema. Lungs are otherwise clear. No evidence of pneumonia seen. Evaluation of the left lung base limited by the overlying heart shadow.   Electronically Signed   By: Bary Richard M.D.   On: 01/25/2015 11:20    EKG:   Orders placed or performed during the hospital encounter of 01/25/15  . EKG 12-Lead  . EKG 12-Lead    ASSESSMENT AND PLAN:   1. Altered mental status secondary to dehydration and hyponatremia: Continue IV fluids but decrease the rate secondary to improvement in sodium and to prevent central pontine myelinolysis. #2 hyponatremia due to poor by mouth intake with nausea vomiting continue fluids. 3.Constipation ; use laxatives 4,AMS;likley delirium in the context of hyponatremia;check MRI brain,neuro consult,check b12 level,folic acid level;add b12 supplements.  All the records are reviewed and case discussed with Care Management/Social Workerr. Management plans discussed with the patient, family and they are in agreement.  CODE STATUS: full  TFullOTAL TIME TAKING CARE OF THIS PATIENT: .   POSSIBLE D/C IN 2-3 DAYS, DEPENDING ON CLINICAL CONDITION. D/w daughter  Katha Hamming M.D on 01/26/2015 at 8:41 AM  Between 7am to 6pm - Pager - [phone]  After 6pm go to [email or web address] - password [email or web address] [facility]  [place] [facility]  Office  [phone]  CC: Primary care physician; No primary  care provider on file.

## 2015-01-26 NOTE — Evaluation (Signed)
Physical Therapy Evaluation Patient Details Name: Jo Moreno MRN: [id number] DOB: [birth year] Today's Date: 01/26/2015   History of Present Illness  81 yo female with onset of syncopal episode and confusion, had elevated BP and encephalopathy, PNA and CHF.  Hx:  dementia, CKD 3, CHF  Clinical Impression  Pt was seen for mobility check and has some family assistance for home.  Pt preference is to go there but may rescind this if pt has any moments of unsafe leaning backward with gait and cannot demonstrate improvement of control of standing balance.    Follow Up Recommendations Home health PT;Supervision/Assistance - 24 hour    Equipment Recommendations  Rolling walker with 5" wheels (if it looks reasonable and safe next visit)    Recommendations for Other Services       Precautions / Restrictions Precautions Precautions: Fall Precaution Comments: bed alarm Restrictions Weight Bearing Restrictions: No      Mobility  Bed Mobility Overal bed mobility: Modified Independent             General bed mobility comments: crawls into bed  Transfers Overall transfer level: Modified independent Equipment used: 2 person hand held assist;Rolling walker (2 wheeled)             General transfer comment: RW was not helpful but HHA kept pt somewhat on track.    Ambulation/Gait Ambulation/Gait assistance: Min assist;+2 physical assistance;+2 safety/equipment Ambulation Distance (Feet): 35 Feet Assistive device: 2 person hand held assist Gait Pattern/deviations: Step-through pattern;Shuffle;Wide base of support;Trunk flexed Gait velocity: reduced Gait velocity interpretation: Below normal speed for age/gender General Gait Details: Pt has a tendency to get dramatically off balance at times near the bed but was capable of controlling to crawl onto bed by herself  Stairs            Wheelchair Mobility    Modified Rankin (Stroke Patients Only)       Balance                                              Pertinent Vitals/Pain Pain Assessment: Faces Pain Score: 8  Faces Pain Scale: Hurts whole lot Pain Location: R knee with initial standing Pain Intervention(s): Limited activity within patient's tolerance;Monitored during session;Repositioned    Home Living Family/patient expects to be discharged to:: Private residence Living Arrangements: Children Available Help at Discharge: Family Type of Home: House Home Access: Stairs to enter Entrance Stairs-Rails: Doctor, general practice of Steps: 5 Home Layout: One level Home Equipment: Cane - single point      Prior Function Level of Independence: Independent with assistive device(s)               Hand Dominance        Extremity/Trunk Assessment   Upper Extremity Assessment: Overall WFL for tasks assessed           Lower Extremity Assessment: Generalized weakness      Cervical / Trunk Assessment: Normal  Communication   Communication: No difficulties;Other (comment) ([place])  Cognition Arousal/Alertness: Lethargic Behavior During Therapy: Flat affect;Impulsive Overall Cognitive Status: History of cognitive impairments - at baseline       Memory: Decreased recall of precautions;Decreased short-term memory              General Comments General comments (skin integrity, edema, etc.): Pt is demonstrating some inconsistencies with her gait that look  like she can control her balance but then will try to lean backward.  Pt is not safe to walk alone and is monitored for this    Exercises        Assessment/Plan    PT Assessment Patient needs continued PT services  PT Diagnosis Difficulty walking;Acute pain   PT Problem List Decreased strength;Decreased range of motion;Decreased activity tolerance;Decreased mobility;Decreased balance;Decreased coordination;Decreased knowledge of use of DME;Decreased cognition;Decreased safety  awareness;Cardiopulmonary status limiting activity;Obesity;Pain  PT Treatment Interventions DME instruction;Gait training;Stair training;Therapeutic activities;Functional mobility training;Therapeutic exercise;Cognitive remediation;Balance training;Neuromuscular re-education;Patient/family education   PT Goals (Current goals can be found in the Care Plan section) Acute Rehab PT Goals Patient Stated Goal: none stated PT Goal Formulation: With patient/family Time For Goal Achievement: 02/09/15 Potential to Achieve Goals: Good    Frequency Min 2X/week   Barriers to discharge   Pt will need 24/7 help to go home    Co-evaluation               End of Session Equipment Utilized During Treatment: Gait belt Activity Tolerance: Patient tolerated treatment well;Patient limited by pain Patient left: in bed;with call bell/phone within reach;with bed alarm set;with family/visitor present Nurse Communication: Mobility status         Time: 1610-9604 PT Time Calculation (min) (ACUTE ONLY): 23 min   Charges:   PT Evaluation $Initial PT Evaluation Tier I: 1 Procedure PT Treatments $Gait Training: 8-22 mins   PT G CodesIvar Drape 02/18/15, 5:35 PM   Samul Dada, PT MS Acute Rehab Dept. Number: [id number] and [phone]

## 2015-01-26 NOTE — Progress Notes (Signed)
Pt alert and oriented. Family member at bedside all shift. Taking clear liqs well. sats 95% on 2 l. N/c. Med for h/a with relief. Up with phys therapy. Went for [employer] and [employer]carotids. Afebrile. Tele nsr.

## 2015-01-27 ENCOUNTER — Inpatient Hospital Stay: Admit: 2015-01-27 | Payer: Medicare Other

## 2015-01-27 ENCOUNTER — Inpatient Hospital Stay
Admit: 2015-01-27 | Discharge: 2015-01-27 | Disposition: A | Discharge status: Home / Self Care | Payer: Medicare Other | Attending: Internal Medicine | Admitting: Internal Medicine

## 2015-01-27 ENCOUNTER — Inpatient Hospital Stay: Payer: Medicare Other

## 2015-01-27 DIAGNOSIS — I639 Cerebral infarction, unspecified: Secondary | ICD-10-CM

## 2015-01-27 DIAGNOSIS — E871 Hypo-osmolality and hyponatremia: Principal | ICD-10-CM

## 2015-01-27 LAB — URINE CULTURE
CULTURE: NO GROWTH
SPECIAL REQUESTS: NORMAL

## 2015-01-27 MED ORDER — CLOPIDOGREL BISULFATE 75 MG PO TABS
75.0000 mg | ORAL_TABLET | Freq: Every day | ORAL | Status: DC
  Administered 2015-01-28 – 2015-01-30 (×3): 75 mg via ORAL
  Filled 2015-01-27 (×3): qty 1

## 2015-01-27 NOTE — Consult Note (Signed)
CC: confusion   HPI: Jo Moreno is an 81 y.o. female [place] speaking with a known history of osteoarthritis and no other significant medical history is brought into the ED with a chief complaint of generalized weakness and altered mental status by her daughter. According to the patient's granddaughter at bedside and patient was outdoors in the hot sun on last Tuesday and was very dehydrated. Sodium of 123. As per daughter at bedside pt's mental status has improved.    Past Medical History  Diagnosis Date  . Arthritis     Past Surgical History  Procedure Laterality Date  . Cholecystectomy    . Abdominal hysterectomy      History reviewed. No pertinent family history.  Social History:  reports that she has never smoked. She has never used smokeless tobacco. She reports that she drinks alcohol. She reports that she does not use illicit drugs.  Allergies  Allergen Reactions  . Aspirin Other (See Comments)    Stomach irritation  . Codeine     Physical Examination: Blood pressure 129/77, pulse 63, temperature 98 F (36.7 C), temperature source Oral, resp. rate 18, height 5\' 2"  (1.575 m), weight 86.41 kg (190 lb 8 oz), SpO2 100 %.   Neurological Examination Mental Status: Confused, but able to tell name with translation.  Cranial Nerves: II: Discs flat bilaterally; Visual fields grossly normal, pupils equal, round, reactive to light and accommodation III,IV, VI: ptosis not present, extra-ocular motions intact bilaterally V,VII: smile symmetric, facial light touch sensation normal bilaterally VIII: hearing normal bilaterally IX,X: gag reflex present XI: bilateral shoulder shrug XII: midline tongue extension Motor: Right : Upper extremity   4/5    Left:     Upper extremity   4/5  Lower extremity   4/5     Lower extremity   4/5 Tone and bulk:normal tone throughout; no atrophy noted Sensory: Pinprick and light touch intact throughout, bilaterally Deep Tendon Reflexes: 2+  and symmetric throughout Plantars: Right: downgoing   Left: downgoing Cerebellar: normal finger-to-nose, normal rapid alternating movements and normal heel-to-shin test Gait: not tested       Laboratory Studies:   Basic Metabolic Panel:  Recent Labs Lab 01/25/15 1032 01/25/15 1852 01/26/15 0325 01/26/15 0841  NA 123* 129* 130* 133*  K 3.9 3.9 3.5 3.6  CL 89* 94* 97* 100*  CO2 24 26 25 26   GLUCOSE 160* 130* 111* 106*  BUN 16 14 13 13   CREATININE 0.63 0.62 0.59 0.62  CALCIUM 8.1* 8.2* 7.9* 8.2*    Liver Function Tests:  Recent Labs Lab 01/25/15 1032  AST 21  ALT 11*  ALKPHOS 62  BILITOT 1.1  PROT 7.4  ALBUMIN 3.7    Recent Labs Lab 01/25/15 1032  LIPASE 10*   No results for input(s): AMMONIA in the last 168 hours.  CBC:  Recent Labs Lab 01/25/15 1032 01/26/15 0325  WBC 10.6 9.6  HGB 14.7 13.2  HCT 42.6 38.6  MCV 83.7 84.6  PLT 231 214    Cardiac Enzymes:  Recent Labs Lab 01/25/15 1032  TROPONINI <0.03    BNP: Invalid input(s): POCBNP  CBG: No results for input(s): GLUCAP in the last 168 hours.  Microbiology: Results for orders placed or performed during the hospital encounter of 01/25/15  Urine culture     Status: None   Collection Time: 01/25/15 11:51 AM  Result Value Ref Range Status   Specimen Description URINE, RANDOM  Final   Special Requests Normal  Final  Culture NO GROWTH 2 DAYS  Final   Report Status 01/27/2015 FINAL  Final  Culture, blood (routine x 2)     Status: None (Preliminary result)   Collection Time: 01/25/15 12:45 PM  Result Value Ref Range Status   Specimen Description BLOOD LEFT FATTY CASTS  Final   Special Requests   Final    BOTTLES DRAWN AEROBIC AND ANAEROBIC  CANT SEE VOLUME   Culture NO GROWTH 2 DAYS  Final   Report Status PENDING  Incomplete  Culture, blood (routine x 2)     Status: None (Preliminary result)   Collection Time: 01/25/15 12:50 PM  Result Value Ref Range Status   Specimen  Description BLOOD LEFT ASSIST CONTROL  Final   Special Requests   Final    BOTTLES DRAWN AEROBIC AND ANAEROBIC  CANT SEE VOLUME   Culture NO GROWTH 2 DAYS  Final   Report Status PENDING  Incomplete    Coagulation Studies: No results for input(s): LABPROT, INR in the last 72 hours.  Urinalysis:  Recent Labs Lab 01/25/15 1151  COLORURINE YELLOW*  LABSPEC 1.025  PHURINE 6.0  GLUCOSEU 50*  HGBUR NEGATIVE  BILIRUBINUR NEGATIVE  KETONESUR 2+*  PROTEINUR NEGATIVE  NITRITE NEGATIVE  LEUKOCYTESUR NEGATIVE    Lipid Panel:  No results found for: CHOL, TRIG, HDL, CHOLHDL, VLDL, LDLCALC  HgbA1C: No results found for: HGBA1C  Urine Drug Screen:  No results found for: LABOPIA, COCAINSCRNUR, LABBENZ, AMPHETMU, THCU, LABBARB  Alcohol Level: No results for input(s): ETH in the last 168 hours.  Other results: EKG: normal EKG, normal sinus rhythm, unchanged from previous tracings.  Imaging: Mr Lodema Pilot Contrast  01/26/2015   CLINICAL DATA:  81 year old female with generalized weakness and altered mental status. Subsequent encounter.  EXAM: MRI HEAD WITHOUT AND WITH CONTRAST  TECHNIQUE: Multiplanar, multiecho pulse sequences of the brain and surrounding structures were obtained without and with intravenous contrast.  CONTRAST:  18mL MULTIHANCE GADOBENATE DIMEGLUMINE 529 MG/ML IV SOLN  COMPARISON:  01/25/2015 CT.  No comparison  FINDINGS: Fast technique imaging had to be utilized.  Acute small nonhemorrhagic parietal -occipital infarcts.  Moderate small vessel disease type changes.  No intracranial hemorrhage.  No hydrocephalus.  No intracranial mass.  Asymmetric enhancement left middle cerebral artery vessels may indicate result of proximal stenosis.  Hyperostosis frontalis interna incidentally noted.  Post lens replacement otherwise orbital structures unremarkable.  Cervical spondylotic changes C3-4.  IMPRESSION: Fast technique imaging had to be utilized.  Acute small nonhemorrhagic  parietal -occipital infarcts.  Moderate small vessel disease type changes.  Asymmetric enhancement left middle cerebral artery vessels may indicate result of proximal stenosis.   Electronically Signed   By: Lacy Duverney M.D.   On: 01/26/2015 14:24   US Carotid Bilateral  01/27/2015   CLINICAL DATA:  Acute cerebral infarction.  EXAM: BILATERAL CAROTID DUPLEX ULTRASOUND  TECHNIQUE: Wallace Cullens scale imaging, color Doppler and duplex ultrasound were performed of bilateral carotid and vertebral arteries in the neck.  COMPARISON:  None.  FINDINGS: Criteria: Quantification of carotid stenosis is based on velocity parameters that correlate the residual internal carotid diameter with NASCET-based stenosis levels, using the diameter of the distal internal carotid lumen as the denominator for stenosis measurement.  The following velocity measurements were obtained:  RIGHT  ICA:  81/20 cm/sec  CCA:  86/12 cm/sec  SYSTOLIC ICA/CCA RATIO:  0.9  DIASTOLIC ICA/CCA RATIO:  1.8  ECA:  110 cm/sec  LEFT  ICA:  68/21 cm/sec  CCA:  85/13 cm/sec  SYSTOLIC ICA/CCA RATIO:  1.1  DIASTOLIC ICA/CCA RATIO:  0.2  ECA:  87 cm/sec  RIGHT CAROTID ARTERY: There is a mild amount of partially calcified plaque at the level of the distal common carotid artery and carotid bulb. A mild amount of calcified plaque extends into the right ICA origin. Velocities and waveforms are normal and estimated right ICA stenosis is less than 50%. The right internal carotid artery is moderately tortuous in the neck.  RIGHT VERTEBRAL ARTERY: Antegrade flow with normal waveform and velocity.  LEFT CAROTID ARTERY: There is a mild amount of partially calcified and calcified plaque in the distal common carotid artery, carotid bulb and proximal ICA. Velocities and waveforms are normal and estimated left ICA stenosis is less than 50%.  LEFT VERTEBRAL ARTERY: Antegrade flow with normal waveform and velocity.  IMPRESSION: Mild amount of plaque at both carotid bifurcations,  including proximal internal carotid arteries. Estimated bilateral ICA stenoses are less than 50%   Electronically Signed   By: Irish Lack M.D.   On: 01/27/2015 11:49     Assessment/Plan:  81 y.o. female [place] speaking with a known history of osteoarthritis and no other significant medical history is brought into the ED with a chief complaint of generalized weakness and altered mental status by her daughter. According to the patient's granddaughter at bedside and patient was outdoors in the hot sun on last Tuesday and was very dehydrated. Sodium of 123. As per daughter at bedside pt's mental status has improved.   Current sodium is 133, mental status improved as per family.  Pt does have occipital/parietal strokes   Appears pt allergic to ASA, plavix restarted. Creat is 0.62 CTA H and neck ordered, better study then MRA D/w pt's daughter at bedside.   01/27/2015, 3:00 PM

## 2015-01-27 NOTE — Progress Notes (Signed)
Physical Therapy Treatment Patient Details Name: Jo Moreno MRN: [id number] DOB: 17-Sep-1924 Today's Date: 01/27/2015    History of Present Illness 81 yo female with onset of syncopal episode and confusion, had elevated BP and encephalopathy, PNA and CHF.  Hx:  dementia, CKD 3, CHF    PT Comments    Pt progressing nicely with stability for gait and transition impending for home.  Her plan is to have HHPT with her family available at all times for her.  Will be training RW as pt allows to offer increased stability as an alternative to furniture walking at home.  Follow Up Recommendations  Home health PT;Supervision/Assistance - 24 hour     Equipment Recommendations  Rolling walker with 5" wheels    Recommendations for Other Services       Precautions / Restrictions Precautions Precautions: Fall Precaution Comments: bed alarm Restrictions Weight Bearing Restrictions: No    Mobility  Bed Mobility Overal bed mobility: Modified Independent                Transfers Overall transfer level: Modified independent Equipment used: 1 person hand held assist             General transfer comment: HHA to support effort but pt can control once standing  Ambulation/Gait Ambulation/Gait assistance: Min guard;Min assist Ambulation Distance (Feet): 100 Feet Assistive device: 1 person hand held assist Gait Pattern/deviations: Step-through pattern;Decreased step length - right;Decreased step length - left;Wide base of support;Drifts right/left Gait velocity: reduced Gait velocity interpretation: Below normal speed for age/gender General Gait Details: reduced fluctuations in balance today, used touch contact on some surfaces with HHA by PT on LUE, very controlled and occas LOB slightly with turns but corrects with light PT contact   Stairs            Wheelchair Mobility    Modified Rankin (Stroke Patients Only)       Balance Overall balance assessment: Needs  assistance Sitting-balance support: Feet supported Sitting balance-Leahy Scale: Good     Standing balance support: Single extremity supported Standing balance-Leahy Scale: Fair Standing balance comment: fair- with dynamic activity                    Cognition Arousal/Alertness: Awake/alert Behavior During Therapy: WFL for tasks assessed/performed Overall Cognitive Status: Within Functional Limits for tasks assessed       Memory: Decreased short-term memory              Exercises      General Comments General comments (skin integrity, edema, etc.): Pt is fatigued by gait and immediately wants to rest on bed.  Using O2 for support to lie in bed but not SOB with gait without the support, pulses normal 60-70 with activity.      Pertinent Vitals/Pain Pain Assessment: Faces Pain Score: 0-No pain    Home Living                      Prior Function            PT Goals (current goals can now be found in the care plan section) Acute Rehab PT Goals Patient Stated Goal: to walk PT Goal Formulation: With patient/family Progress towards PT goals: Progressing toward goals    Frequency  7X/week    PT Plan Current plan remains appropriate    Co-evaluation             End of Session Equipment Utilized During Treatment: Gait  belt Activity Tolerance: Patient tolerated treatment well;Patient limited by pain Patient left: in bed;with call bell/phone within reach;with bed alarm set;with family/visitor present     Time: 4098-1191 PT Time Calculation (min) (ACUTE ONLY): 26 min  Charges:  $Gait Training: 8-22 mins $Therapeutic Activity: 8-22 mins                    G Codes:      Ivar Drape 02-06-15, 3:58 PM  Samul Dada, PT MS Acute Rehab Dept. Number: [facility] [id number] and [facility] [phone]

## 2015-01-27 NOTE — Progress Notes (Signed)
Pt feeling somewhat  Better. Speech is improved per daughter. Appetite good. Iv resited in rt hand. Up to walk in hallway with phys therapy. tol well. Med once with relkief of ha with tylenol. Vss. Tele nsr.

## 2015-01-27 NOTE — Progress Notes (Signed)
[facility] [place] [facility]   PATIENT NAME: Jo Moreno    MR#:  [id number]  DATE OF BIRTH:  Sep 26, 1924  SUBJECTIVE: 81 year old [place] female brought in by family secondary to confusion. According to the daughter patient is very functional and the cooks at home and walks without any help. Noted to have confusion for the past 2 days. Admitted for hyponatremia of sodium 123 , now improved with fluids, today she is very alert and oriented. Denies any complaints.   CHIEF COMPLAINT:   Chief Complaint  Patient presents with  . Weakness  . Constipation    REVIEW OF SYSTEMS:   Review of Systems  Constitutional: Negative for fever and chills.  HENT: Negative for hearing loss.   Eyes: Negative for blurred vision, double vision and photophobia.  Respiratory: Negative for cough, hemoptysis and shortness of breath.   Cardiovascular: Negative for palpitations, orthopnea and leg swelling.  Gastrointestinal: Negative for vomiting, abdominal pain and diarrhea.  Genitourinary: Negative for dysuria and urgency.  Musculoskeletal: Negative for myalgias and neck pain.  Skin: Negative for rash.  Neurological: Negative for dizziness, focal weakness, seizures, weakness and headaches.  Psychiatric/Behavioral: Negative for memory loss. The patient does not have insomnia.     DRUG ALLERGIES:   Allergies  Allergen Reactions  . Aspirin Other (See Comments)    Stomach irritation  . Codeine     VITALS:  Blood pressure 148/50, pulse 59, temperature 97.3 F (36.3 C), temperature source Oral, resp. rate 20, height 5\' 2"  (1.575 m), weight 86.41 kg (190 lb 8 oz), SpO2 99 %.  PHYSICAL EXAMINATION:  GENERAL:  81 y.o.-year-old patient lying in the bed with no acute distress.  EYES: Pupils equal, round, reactive to light and accommodation. No scleral icterus. Extraocular muscles intact.  HEENT: Head atraumatic, normocephalic. Oropharynx and nasopharynx clear.  NECK:   Supple, no jugular venous distention. No thyroid enlargement, no tenderness.  LUNGS: Normal breath sounds bilaterally, no wheezing, rales,rhonchi or crepitation. No use of accessory muscles of respiration.  CARDIOVASCULAR: S1, S2 normal. No murmurs, rubs, or gallops.  ABDOMEN: Soft, nontender, nondistended. Bowel sounds present. No organomegaly or mass.  EXTREMITIES: No pedal edema, cyanosis, or clubbing.  NEUROLOGIC: Cranial nerves II-12 intact, motor 5 x 5 upper and lower extremities DTR 2+ bilaterally, SENSATIONS ARE INTACT.Marland Kitchen PSYCHIATRIC: Alert and oriented. SKIN: No obvious rash, lesion, or ulcer.    LABORATORY PANEL:   CBC  Recent Labs Lab 01/26/15 0325  WBC 9.6  HGB 13.2  HCT 38.6  PLT 214   ------------------------------------------------------------------------------------------------------------------  Chemistries   Recent Labs Lab 01/25/15 1032  01/26/15 0841  NA 123*  < > 133*  K 3.9  < > 3.6  CL 89*  < > 100*  CO2 24  < > 26  GLUCOSE 160*  < > 106*  BUN 16  < > 13  CREATININE 0.63  < > 0.62  CALCIUM 8.1*  < > 8.2*  AST 21  --   --   ALT 11*  --   --   ALKPHOS 62  --   --   BILITOT 1.1  --   --   < > = values in this interval not displayed. ------------------------------------------------------------------------------------------------------------------  Cardiac Enzymes  Recent Labs Lab 01/25/15 1032  TROPONINI <0.03   ------------------------------------------------------------------------------------------------------------------  RADIOLOGY:  Ct Head Wo Contrast  01/25/2015   CLINICAL DATA:  Altered mental status for 1-2 days. Decreased appetite and weakness.  EXAM: CT HEAD WITHOUT CONTRAST  TECHNIQUE: Contiguous axial images were obtained from the base of the skull through the vertex without intravenous contrast.  COMPARISON:  Head CT dated 07/26/2014.  FINDINGS: There is mild generalized brain atrophy with commensurate dilatation of the  ventricles and sulci. Chronic small vessel ischemic changes again noted within the bilateral periventricular and subcortical white matter regions.  There is no mass, hemorrhage, edema, or other evidence of acute parenchymal abnormality. No extra-axial hemorrhage. No osseous abnormality. Visualized upper paranasal sinuses are clear. Mastoid air cells are clear.  IMPRESSION: 1. Atrophy and chronic ischemic changes as detailed above. 2. No evidence of acute intracranial abnormality. No intracranial mass, hemorrhage, or edema.   Electronically Signed   By: Bary Richard M.D.   On: 01/25/2015 11:26   Mr [name] ZO Contrast  01/26/2015   CLINICAL DATA:  81 year old female with generalized weakness and altered mental status. Subsequent encounter.  EXAM: MRI HEAD WITHOUT AND WITH CONTRAST  TECHNIQUE: Multiplanar, multiecho pulse sequences of the brain and surrounding structures were obtained without and with intravenous contrast.  CONTRAST:  18mL MULTIHANCE GADOBENATE DIMEGLUMINE 529 MG/ML IV SOLN  COMPARISON:  01/25/2015 CT.  No comparison  FINDINGS: Fast technique imaging had to be utilized.  Acute small nonhemorrhagic parietal -occipital infarcts.  Moderate small vessel disease type changes.  No intracranial hemorrhage.  No hydrocephalus.  No intracranial mass.  Asymmetric enhancement left middle cerebral artery vessels may indicate result of proximal stenosis.  Hyperostosis frontalis interna incidentally noted.  Post lens replacement otherwise orbital structures unremarkable.  Cervical spondylotic changes C3-4.  IMPRESSION: Fast technique imaging had to be utilized.  Acute small nonhemorrhagic parietal -occipital infarcts.  Moderate small vessel disease type changes.  Asymmetric enhancement left middle cerebral artery vessels may indicate result of proximal stenosis.   Electronically Signed   By: Lacy Duverney M.D.   On: 01/26/2015 14:24   Dg Chest Portable 1 View  01/25/2015   CLINICAL DATA:   Constipation, decreased appetite, weakness. Patient also reports nausea and vomiting. Patient also states chest tightness and shortness of breath.  EXAM: PORTABLE CHEST - 1 VIEW  COMPARISON:  None.  FINDINGS: Heart is enlarged. Questionable mild bibasilar interstitial edema. Evaluation of the left lung base is limited by the overlying heart shadow. No large pleural effusion seen. Cannot exclude small left effusion at the costophrenic angle.  IMPRESSION: Cardiomegaly.  No convincing evidence of congestive heart failure.  Perhaps mild bibasilar interstitial edema. Lungs are otherwise clear. No evidence of pneumonia seen. Evaluation of the left lung base limited by the overlying heart shadow.   Electronically Signed   By: Bary Richard M.D.   On: 01/25/2015 11:20    EKG:   Orders placed or performed during the hospital encounter of 01/25/15  . EKG 12-Lead  . EKG 12-Lead    ASSESSMENT AND PLAN:   1. Altered mental status secondary to dehydration and hyponatremia: Corrected, discontinue IV fluids  #2 hyponatremia due to poor by mouth intake with nausea vomiting   3.Constipation ; use laxatives 4, acute nonhemorrhagic stroke   Of parieto occipital lobes;continue ASA,d/cplavix and Lovenox,check fasting lipids.,PT recommends physical therapy., Check carotid ultrasound, echocardiogram to finish her workup. Patient does have a possible MCA stenosis and we will order MRA of the brain.  neurology consult is pending Tried to call her primary doctor in [place] at [phone] going to answeirng  services will try to call again. Patient is visiting her daughter and  Pt  t is staying  with her son at [place] . All the records are reviewed and case discussed with Care Management/Social Workerr. Management plans discussed with the patient, family and they are in agreement.  CODE STATUS: full  TFullOTAL TIME TAKING CARE OF THIS PATIENT: .   POSSIBLE D/C IN 2-3 DAYS, DEPENDING ON CLINICAL  CONDITION. D/w daughter  Katha Hamming M.D on 01/27/2015 at 10:14 AM  Between 7am to 6pm - Pager - [phone]  After 6pm go to [email or web address] - password [email or web address] [facility]  [facility]  Office  [phone]  CC: Primary care physician; No primary care provider on file.

## 2015-01-27 NOTE — Progress Notes (Signed)
*  PRELIMINARY RESULTS* Echocardiogram 2D Echocardiogram has been performed.  Garrel Ridgel Stills 01/27/2015, 12:33 PM

## 2015-01-28 DIAGNOSIS — I6330 Cerebral infarction due to thrombosis of unspecified cerebral artery: Secondary | ICD-10-CM | POA: Insufficient documentation

## 2015-01-28 NOTE — Care Management Important Message (Signed)
Important Message  Patient Details  Name: Jo Moreno MRN: [id number] Date of Birth: 1925/01/23   Medicare Important Message Given:  Yes-second notification given    Olegario Messier A Allmond 01/28/2015, 11:51 AM

## 2015-01-28 NOTE — Progress Notes (Signed)
PT Cancellation Note  Patient Details Name: Jo Moreno MRN: [id number] DOB: 1925/03/13   Cancelled Treatment:    Reason Eval/Treat Not Completed: Patient declined, no reason specified. Treatment attempted again this pm. Pt notes she continues with dizziness and dyplopia. Pt also complains of being very lethargic. Will attempt treatment tomorrow.     Elsie Stain Bishop 01/28/2015, 2:19 PM

## 2015-01-28 NOTE — Progress Notes (Signed)
PT Cancellation Note  Patient Details Name: Jo Moreno MRN: [id number] DOB: Jan 18, 1925   Cancelled Treatment:    Reason Eval/Treat Not Completed: Patient declined, no reason specified. Attempted to see pt. Daughter in room. Pt moaning and speaks with daughter in native tongue stating she cannot participate with PT. Pt complains of dizziness and seeing double. Nursing notified and is in to assess pt. Will re attempt treatment this pm.   Kristeen Miss 01/28/2015, 11:59 AM

## 2015-01-28 NOTE — Care Management (Signed)
Presented to ED with confusion.  Patient is of [place] descent and does not speak english.  Physical therapy has recommended home with home health physical therapy. She has ruled in for CVA and has significant hyponatremia. Will need to speak with patient in the presence of family members to discuss discharge plan with home health and primary care physician

## 2015-01-28 NOTE — Plan of Care (Signed)
Problem: Progression Outcomes Goal: Tolerating diet/TF at goal rate Outcome: Not Progressing Pt tolerating clear liquids.  Needs diet advance to solids. Goal: Pain controlled Outcome: Completed/Met Date Met:  01/28/15 Complaints of headache relieved with Tylenol. Goal: Educational plan initiated Outcome: Completed/Met Date Met:  01/28/15 Stroke booklet given to daughter. Goal: Initial discharge plan initiated Outcome: Completed/Met Date Met:  01/28/15 Plans to return home with daughter.

## 2015-01-28 NOTE — Progress Notes (Signed)
[facility] - [place] at [facility]   PATIENT NAME: Jo Moreno    MR#:  [id number]  DATE OF BIRTH:  Jul 08, 1924  SUBJECTIVE: 81 year old [place] female brought in by family secondary to confusion. According to the daughter patient is very functional and the cooks at home and walks without any help. Noted to have confusion for the past 2 days. Admitted for hyponatremia of sodium 123 hyponatremia is  corrected patient is alert and oriented..   CHIEF COMPLAINT:   Chief Complaint  Patient presents with  . Weakness  . Constipation    REVIEW OF SYSTEMS:   Review of Systems  Constitutional: Negative for fever and chills.  HENT: Negative for hearing loss.   Eyes: Negative for blurred vision, double vision and photophobia.  Respiratory: Negative for cough, hemoptysis and shortness of breath.   Cardiovascular: Negative for palpitations, orthopnea and leg swelling.  Gastrointestinal: Negative for vomiting, abdominal pain and diarrhea.  Genitourinary: Negative for dysuria and urgency.  Musculoskeletal: Negative for myalgias and neck pain.  Skin: Negative for rash.  Neurological: Negative for dizziness, focal weakness, seizures, weakness and headaches.  Psychiatric/Behavioral: Negative for memory loss. The patient does not have insomnia.     DRUG ALLERGIES:   Allergies  Allergen Reactions  . Aspirin Other (See Comments)    Stomach irritation  . Codeine     VITALS:  Blood pressure 148/67, pulse 60, temperature 97.8 F (36.6 C), temperature source Oral, resp. rate 18, height 5\' 2"  (1.575 m), weight 86.41 kg (190 lb 8 oz), SpO2 99 %.  PHYSICAL EXAMINATION:  GENERAL:  81 y.o.-year-old patient lying in the bed with no acute distress.  EYES: Pupils equal, round, reactive to light and accommodation. No scleral icterus. Extraocular muscles intact.  HEENT: Head atraumatic, normocephalic. Oropharynx and nasopharynx clear.  NECK:  Supple, no jugular venous  distention. No thyroid enlargement, no tenderness.  LUNGS: Normal breath sounds bilaterally, no wheezing, rales,rhonchi or crepitation. No use of accessory muscles of respiration.  CARDIOVASCULAR: S1, S2 normal. No murmurs, rubs, or gallops.  ABDOMEN: Soft, nontender, nondistended. Bowel sounds present. No organomegaly or mass.  EXTREMITIES: No pedal edema, cyanosis, or clubbing.  NEUROLOGIC: Cranial nerves II-12 intact, motor 5 x 5 upper and lower extremities DTR 2+ bilaterally, SENSATIONS ARE INTACT.Marland Kitchen PSYCHIATRIC: Alert and oriented. SKIN: No obvious rash, lesion, or ulcer.    LABORATORY PANEL:   CBC  Recent Labs Lab 01/26/15 0325  WBC 9.6  HGB 13.2  HCT 38.6  PLT 214   ------------------------------------------------------------------------------------------------------------------  Chemistries   Recent Labs Lab 01/25/15 1032  01/26/15 0841  NA 123*  < > 133*  K 3.9  < > 3.6  CL 89*  < > 100*  CO2 24  < > 26  GLUCOSE 160*  < > 106*  BUN 16  < > 13  CREATININE 0.63  < > 0.62  CALCIUM 8.1*  < > 8.2*  AST 21  --   --   ALT 11*  --   --   ALKPHOS 62  --   --   BILITOT 1.1  --   --   < > = values in this interval not displayed. ------------------------------------------------------------------------------------------------------------------  Cardiac Enzymes  Recent Labs Lab 01/25/15 1032  TROPONINI <0.03   ------------------------------------------------------------------------------------------------------------------  RADIOLOGY:  Mr Lodema Pilot Contrast  01/26/2015   CLINICAL DATA:  81 year old female with generalized weakness and altered mental status. Subsequent encounter.  EXAM: MRI HEAD WITHOUT AND WITH CONTRAST  TECHNIQUE: Multiplanar, multiecho pulse sequences of the brain and surrounding structures were obtained without and with intravenous contrast.  CONTRAST:  18mL MULTIHANCE GADOBENATE DIMEGLUMINE 529 MG/ML IV SOLN  COMPARISON:  01/25/2015  CT.  No comparison  FINDINGS: Fast technique imaging had to be utilized.  Acute small nonhemorrhagic parietal -occipital infarcts.  Moderate small vessel disease type changes.  No intracranial hemorrhage.  No hydrocephalus.  No intracranial mass.  Asymmetric enhancement left middle cerebral artery vessels may indicate result of proximal stenosis.  Hyperostosis frontalis interna incidentally noted.  Post lens replacement otherwise orbital structures unremarkable.  Cervical spondylotic changes C3-4.  IMPRESSION: Fast technique imaging had to be utilized.  Acute small nonhemorrhagic parietal -occipital infarcts.  Moderate small vessel disease type changes.  Asymmetric enhancement left middle cerebral artery vessels may indicate result of proximal stenosis.   Electronically Signed   By: Lacy Duverney M.D.   On: 01/26/2015 14:24   US Carotid Bilateral  01/27/2015   CLINICAL DATA:  Acute cerebral infarction.  EXAM: BILATERAL CAROTID DUPLEX ULTRASOUND  TECHNIQUE: Wallace Cullens scale imaging, color Doppler and duplex ultrasound were performed of bilateral carotid and vertebral arteries in the neck.  COMPARISON:  None.  FINDINGS: Criteria: Quantification of carotid stenosis is based on velocity parameters that correlate the residual internal carotid diameter with NASCET-based stenosis levels, using the diameter of the distal internal carotid lumen as the denominator for stenosis measurement.  The following velocity measurements were obtained:  RIGHT  ICA:  81/20 cm/sec  CCA:  86/12 cm/sec  SYSTOLIC ICA/CCA RATIO:  0.9  DIASTOLIC ICA/CCA RATIO:  1.8  ECA:  110 cm/sec  LEFT  ICA:  68/21 cm/sec  CCA:  85/13 cm/sec  SYSTOLIC ICA/CCA RATIO:  1.1  DIASTOLIC ICA/CCA RATIO:  0.2  ECA:  87 cm/sec  RIGHT CAROTID ARTERY: There is a mild amount of partially calcified plaque at the level of the distal common carotid artery and carotid bulb. A mild amount of calcified plaque extends into the right ICA origin. Velocities and waveforms are normal  and estimated right ICA stenosis is less than 50%. The right internal carotid artery is moderately tortuous in the neck.  RIGHT VERTEBRAL ARTERY: Antegrade flow with normal waveform and velocity.  LEFT CAROTID ARTERY: There is a mild amount of partially calcified and calcified plaque in the distal common carotid artery, carotid bulb and proximal ICA. Velocities and waveforms are normal and estimated left ICA stenosis is less than 50%.  LEFT VERTEBRAL ARTERY: Antegrade flow with normal waveform and velocity.  IMPRESSION: Mild amount of plaque at both carotid bifurcations, including proximal internal carotid arteries. Estimated bilateral ICA stenoses are less than 50%   Electronically Signed   By: Irish Lack M.D.   On: 01/27/2015 11:49    EKG:   Orders placed or performed during the hospital encounter of 01/25/15  . EKG 12-Lead  . EKG 12-Lead    ASSESSMENT AND PLAN:   1. Altered mental status secondary to dehydration and hyponatremia: Corrected, discontinue IV fluids  #2 hyponatremia due to poor by mouth intake with nausea vomiting ;resolved.  3.Constipation ; use laxatives 4, acute nonhemorrhagic stroke   Of parieto occipital lobes; allergy to aspirin so plavix  restarted. check fasting lipids.,PT recommends physical therapy., Carotids, echo are  normal.. Patient does have a possible MCA stenosis .seen by neurology,ordered CTA  Stated regular diet. Marland Kitchen  likely Discharge tomorrow All the records are reviewed and case discussed with Care Management/Social Workerr. Management plans discussed with the patient,  family and they are in agreement.  CODE STATUS: full  TFullOTAL TIME TAKING CARE OF THIS PATIENT: .   POSSIBLE D/C IN 2-3 DAYS, DEPENDING ON CLINICAL CONDITION. D/w daughter  Katha Hamming M.D on 01/28/2015 at 12:00 PM  Between 7am to 6pm - Pager - [phone]  After 6pm go to [email or web address] - password [email or web address] [facility]  [facility]  Office   [phone]  CC: Primary care physician; No primary care provider on file.

## 2015-01-28 NOTE — Progress Notes (Signed)
NEUROLOGY  S: Pt is having lots abdominal pain and constipation;  Confusion improved per family at bedside  ROS neg x 8 systems except as above  O: AFVSS NAD, nl weight Normocephalic, oropharynx clear Supple, nl LAD CTA B, no wheezing RRR, no murmur Tender and mild distension in belly  Alert and oriented x 2 not time, nl language  PERRLA, EOMI, face symmetric 4/5 B, nl tone  MRI personally reviewed by me and shows small R hemispheric infarcts  A/P: 1.Encephalopathy-  Improved, likely due to hyponatremia 2. Small R occipital infarct-  Not cause of confusion and asymptomatic, appears embolic but no source -  Continue to correct sodium -  Ok to continue plavix  daily -  Lipids pending and would start statin for > 70 LDL -  Good BP control with goal < 130/80 -  Recommend holter for 30 days -  Will sign off, please call with questions -  F/u with [facility] in 3 months or sooner if problems

## 2015-01-29 LAB — LIPID PANEL
CHOLESTEROL: 156 mg/dL (ref 0–200)
HDL: 30 mg/dL — AB (ref >40)
LDL CALC: 104 mg/dL — AB (ref 0–99)
TRIGLYCERIDES: 109 mg/dL (ref <150)
Total CHOL/HDL Ratio: 5.2 RATIO
VLDL: 22 mg/dL (ref 0–40)

## 2015-01-29 MED ORDER — ATORVASTATIN CALCIUM 40 MG PO TABS: 40.0000 mg | ORAL_TABLET | Freq: Every day | ORAL | Status: AC

## 2015-01-29 MED ORDER — METOPROLOL TARTRATE 25 MG PO TABS: 25.0000 mg | ORAL_TABLET | Freq: Two times a day (BID) | ORAL | Status: DC

## 2015-01-29 MED ORDER — HYDRALAZINE HCL 25 MG PO TABS
25.0000 mg | ORAL_TABLET | Freq: Three times a day (TID) | ORAL | Status: DC
  Administered 2015-01-29: 25 mg via ORAL
  Filled 2015-01-29 (×2): qty 1

## 2015-01-29 MED ORDER — SODIUM CHLORIDE 0.9 % IV BOLUS (SEPSIS)
1000.0000 mL | Freq: Once | INTRAVENOUS | Status: AC
  Administered 2015-01-29: 1000 mL via INTRAVENOUS

## 2015-01-29 MED ORDER — SIMVASTATIN 40 MG PO TABS: 40.0000 mg | ORAL_TABLET | Freq: Every day | ORAL | Status: DC

## 2015-01-29 NOTE — Discharge Summary (Signed)
Jo Moreno, is a 81 y.o. female  DOB 1925-06-17  MRN [id number].  Admission date:  01/25/2015  Admitting Physician  Jo Moreno  Discharge Date:  01/29/2015   Primary Moreno  No primary care provider on file.  Recommendations for primary care physician for things to follow:   Follow-up with her primary doctor in [place].   Admission Diagnosis  Acute hyponatremia [E87.1] Altered mental status, unspecified altered mental status type [R41.82]   Discharge Diagnosis  Acute hyponatremia [E87.1] Altered mental status, unspecified altered mental status type [R41.82]    Active Problems:   Hyponatremia   Cerebral thrombosis with cerebral infarction      Past Medical History  Diagnosis Date  . Arthritis     Past Surgical History  Procedure Laterality Date  . Cholecystectomy    . Abdominal hysterectomy         History of present illness and  Hospital Course:     Kindly see H&P for history of present illness and admission details, please review complete Labs, Consult reports and Test reports for all details in brief  HPI  from the history and physical done on the day of admission  81 year old female brought in by family secondary to altered mental status generalized weakness. Found to have hyponatremia sodium of 123 on admission  Hospital Course  #1 altered mental status with encephalopathy secondary to hyponatremia and dehydration. Patient received IV fluids with normal saline and the patient's sodium was checked frequently, patient of hyponatremia corrected and her sodium improved to 133. Confusion also resolved.  #2: Patient has a small right occipital infarct: Patient started on the Plavix, seen by neurology. Recommended  30 day Holter, patient is allergic to aspirin so we'll continue her Plavix.  Echocardiogram showed EF more than 50% ,ultrasound of carotids did not showsignificant stenosis. Physical therapy, I recommended home health PT with a rolling walker. Patient is visiting.r from [place]. Patient is planning to go back to [facility]  Where her primary doctor  Is.called her  primary doctor Jo Moreno at [phone] and left a message to call us back.  #3 hyperlipidemia with LDL of more than 101 started on simvastatin. Number for constipation; use stool softeners as needed.  Discharge Condition: stable   Follow UP  Follow-up Information    Follow up with [facility], Jo Moreno. Go in 1 week.   Specialty:  Neurology   Why:  First available Jo Moreno Tuesday, September 6that 230pm, ccs   Contact information:   [address] [place] [address] [phone]         Discharge Instructions  and  Discharge Medications        Medication List    TAKE these medications        acetaminophen 325 MG tablet  Commonly known as:  TYLENOL  Take 650 mg by mouth every 6 (six) hours as needed.     clopidogrel 75 MG tablet  Commonly known as:  PLAVIX  Take 1 tablet (75 mg total) by mouth daily.     esomeprazole 40 MG capsule  Commonly known as:  NEXIUM  Take 40 mg by mouth daily at 12 noon.     FLOVENT HFA 110 MCG/ACT inhaler  Generic drug:  fluticasone  Inhale 2 puffs into the lungs 2 (two) times daily.     metoprolol tartrate 25 MG tablet  Commonly known as:  LOPRESSOR  Take 1 tablet (25 mg total) by mouth 2 (two)  times daily.     multivitamin capsule  Take 1 capsule by mouth daily.     naproxen 500 MG tablet  Commonly known as:  NAPROSYN  Take 500 mg by mouth 2 (two) times daily with a meal.     PROAIR HFA 108 (90 BASE) MCG/ACT inhaler  Generic drug:  albuterol  Inhale 2 puffs into the lungs 4 (four) times daily as needed.     simvastatin 40 MG tablet  Commonly known as:  ZOCOR  Take 1 tablet (40 mg total) by mouth daily.     vitamin B-12 1000  MCG tablet  Commonly known as:  CYANOCOBALAMIN  Take 1,000 mcg by mouth daily.          Diet and Activity recommendation: See Discharge Instructions above   Consults obtained -neurology   Major procedures and Radiology Reports - PLEASE review detailed and final reports for all details, in brief -      Ct Head Wo Contrast  01/25/2015   CLINICAL DATA:  Altered mental status for 1-2 days. Decreased appetite and weakness.  EXAM: CT HEAD WITHOUT CONTRAST  TECHNIQUE: Contiguous axial images were obtained from the base of the skull through the vertex without intravenous contrast.  COMPARISON:  Head CT dated 07/26/2014.  FINDINGS: There is mild generalized brain atrophy with commensurate dilatation of the ventricles and sulci. Chronic small vessel ischemic changes again noted within the bilateral periventricular and subcortical white matter regions.  There is no mass, hemorrhage, edema, or other evidence of acute parenchymal abnormality. No extra-axial hemorrhage. No osseous abnormality. Visualized upper paranasal sinuses are clear. Mastoid air cells are clear.  IMPRESSION: 1. Atrophy and chronic ischemic changes as detailed above. 2. No evidence of acute intracranial abnormality. No intracranial mass, hemorrhage, or edema.   Electronically Signed   By: Bary Richard M.D.   On: 01/25/2015 11:26   Mr [name] ZO Contrast  01/26/2015   CLINICAL DATA:  81 year old female with generalized weakness and altered mental status. Subsequent encounter.  EXAM: MRI HEAD WITHOUT AND WITH CONTRAST  TECHNIQUE: Multiplanar, multiecho pulse sequences of the brain and surrounding structures were obtained without and with intravenous contrast.  CONTRAST:  18mL MULTIHANCE GADOBENATE DIMEGLUMINE 529 MG/ML IV SOLN  COMPARISON:  01/25/2015 CT.  No comparison  FINDINGS: Fast technique imaging had to be utilized.  Acute small nonhemorrhagic parietal -occipital infarcts.  Moderate small vessel disease type changes.   No intracranial hemorrhage.  No hydrocephalus.  No intracranial mass.  Asymmetric enhancement left middle cerebral artery vessels may indicate result of proximal stenosis.  Hyperostosis frontalis interna incidentally noted.  Post lens replacement otherwise orbital structures unremarkable.  Cervical spondylotic changes C3-4.  IMPRESSION: Fast technique imaging had to be utilized.  Acute small nonhemorrhagic parietal -occipital infarcts.  Moderate small vessel disease type changes.  Asymmetric enhancement left middle cerebral artery vessels may indicate result of proximal stenosis.   Electronically Signed   By: Lacy Duverney M.D.   On: 01/26/2015 14:24   US Carotid Bilateral  01/27/2015   CLINICAL DATA:  Acute cerebral infarction.  EXAM: BILATERAL CAROTID DUPLEX ULTRASOUND  TECHNIQUE: Wallace Cullens scale imaging, color Doppler and duplex ultrasound were performed of bilateral carotid and vertebral arteries in the neck.  COMPARISON:  None.  FINDINGS: Criteria: Quantification of carotid stenosis is based on velocity parameters that correlate the residual internal carotid diameter with NASCET-based stenosis levels, using the diameter of the distal internal carotid lumen as the denominator for stenosis measurement.  The following velocity measurements were obtained:  RIGHT  ICA:  81/20 cm/sec  CCA:  86/12 cm/sec  SYSTOLIC ICA/CCA RATIO:  0.9  DIASTOLIC ICA/CCA RATIO:  1.8  ECA:  110 cm/sec  LEFT  ICA:  68/21 cm/sec  CCA:  85/13 cm/sec  SYSTOLIC ICA/CCA RATIO:  1.1  DIASTOLIC ICA/CCA RATIO:  0.2  ECA:  87 cm/sec  RIGHT CAROTID ARTERY: There is a mild amount of partially calcified plaque at the level of the distal common carotid artery and carotid bulb. A mild amount of calcified plaque extends into the right ICA origin. Velocities and waveforms are normal and estimated right ICA stenosis is less than 50%. The right internal carotid artery is moderately tortuous in the neck.  RIGHT VERTEBRAL ARTERY: Antegrade flow with normal  waveform and velocity.  LEFT CAROTID ARTERY: There is a mild amount of partially calcified and calcified plaque in the distal common carotid artery, carotid bulb and proximal ICA. Velocities and waveforms are normal and estimated left ICA stenosis is less than 50%.  LEFT VERTEBRAL ARTERY: Antegrade flow with normal waveform and velocity.  IMPRESSION: Mild amount of plaque at both carotid bifurcations, including proximal internal carotid arteries. Estimated bilateral ICA stenoses are less than 50%   Electronically Signed   By: Irish Lack M.D.   On: 01/27/2015 11:49   Dg Chest Portable 1 View  01/25/2015   CLINICAL DATA:  Constipation, decreased appetite, weakness. Patient also reports nausea and vomiting. Patient also states chest tightness and shortness of breath.  EXAM: PORTABLE CHEST - 1 VIEW  COMPARISON:  None.  FINDINGS: Heart is enlarged. Questionable mild bibasilar interstitial edema. Evaluation of the left lung base is limited by the overlying heart shadow. No large pleural effusion seen. Cannot exclude small left effusion at the costophrenic angle.  IMPRESSION: Cardiomegaly.  No convincing evidence of congestive heart failure.  Perhaps mild bibasilar interstitial edema. Lungs are otherwise clear. No evidence of pneumonia seen. Evaluation of the left lung base limited by the overlying heart shadow.   Electronically Signed   By: Bary Richard M.D.   On: 01/25/2015 11:20    Micro Results     Recent Results (from the past 240 hour(s))  Urine culture     Status: None   Collection Time: 01/25/15 11:51 AM  Result Value Ref Range Status   Specimen Description URINE, RANDOM  Final   Special Requests Normal  Final   Culture NO GROWTH 2 DAYS  Final   Report Status 01/27/2015 FINAL  Final  Culture, blood (routine x 2)     Status: None (Preliminary result)   Collection Time: 01/25/15 12:45 PM  Result Value Ref Range Status   Specimen Description BLOOD LEFT FATTY CASTS  Final   Special Requests    Final    BOTTLES DRAWN AEROBIC AND ANAEROBIC  CANT SEE VOLUME   Culture NO GROWTH 4 DAYS  Final   Report Status PENDING  Incomplete  Culture, blood (routine x 2)     Status: None (Preliminary result)   Collection Time: 01/25/15 12:50 PM  Result Value Ref Range Status   Specimen Description BLOOD LEFT ASSIST CONTROL  Final   Special Requests   Final    BOTTLES DRAWN AEROBIC AND ANAEROBIC  CANT SEE VOLUME   Culture NO GROWTH 4 DAYS  Final   Report Status PENDING  Incomplete       Today   Subjective:   Jo Moreno today has no headache,no chest abdominal pain,no new  weakness tingling or numbness, feels much better wants to go home today.  Objective:   Blood pressure 144/49, pulse 63, temperature 98.2 F (36.8 C), temperature source Oral, resp. rate 20, height 5\' 2"  (1.575 m), weight 86.41 kg (190 lb 8 oz), SpO2 100 %.   Intake/Output Summary (Last 24 hours) at 01/29/15 1105 Last data filed at 01/29/15 0814  Gross per 24 hour  Intake    240 ml  Output    750 ml  Net   -510 ml    Exam Awake Alert, Oriented x 3, No new F.N deficits, Normal affect [place].AT,PERRAL Supple Neck,No JVD, No cervical lymphadenopathy appriciated.  Symmetrical Chest wall movement, Good air movement bilaterally, CTAB RRR,No Gallops,Rubs or new Murmurs, No Parasternal Heave +ve B.Sounds, Abd Soft, Non tender, No organomegaly appriciated, No rebound -guarding or rigidity. No Cyanosis, Clubbing or edema, No new Rash or bruise  Data Review   CBC w Diff: Moreno Results  Component Value Date   WBC 9.6 01/26/2015   HGB 13.2 01/26/2015   HCT 38.6 01/26/2015   PLT 214 01/26/2015   LYMPHOPCT 28 07/26/2014   MONOPCT 7 07/26/2014   EOSPCT 1 07/26/2014   BASOPCT 0 07/26/2014    CMP: Moreno Results  Component Value Date   NA 133* 01/26/2015   K 3.6 01/26/2015   CL 100* 01/26/2015   CO2 26 01/26/2015   BUN 13 01/26/2015   CREATININE 0.62 01/26/2015   PROT 7.4 01/25/2015   ALBUMIN 3.7 01/25/2015    BILITOT 1.1 01/25/2015   ALKPHOS 62 01/25/2015   AST 21 01/25/2015   ALT 11* 01/25/2015  .   Total Time in preparing paper work, data evaluation and todays exam - 35 minutes  Jennilee Demarco M.D on 01/29/2015 at 11:05 AM

## 2015-01-29 NOTE — Care Management (Signed)
Patient is to travel back to [place] at the time of discharge.  Daughter is arranging for air fare and will be travel ing with patient.  She is just visiting her from [place]. Unable to set up home health.  Daughter had questions about reapplying for patient's medicare.  Patient has Educational psychologist.  Explained that does not have to reapply for medicare.  Asked if she was talking about medicaid and daughter says She does not know.  During this discussion, patient was standing and brushing her hair.  She became dizzy an had near syncope.  CM provided physical support and obtained help to assist patient back to bed.  discharge was cancelled for today

## 2015-01-29 NOTE — Progress Notes (Signed)
[facility] - [place] at [facility]   PATIENT NAME: Jo Moreno    MR#:  [id number]  DATE OF BIRTH:  1924-07-14  SUBJECTIVE:discharge cancelled due to near syncope./orthostatic hypotension,  CHIEF COMPLAINT:   Chief Complaint  Patient presents with  . Weakness  . Constipation    REVIEW OF SYSTEMS:    Review of Systems  Constitutional: Negative for fever and chills.  HENT: Negative for hearing loss.   Eyes: Negative for blurred vision, double vision and photophobia.  Respiratory: Negative for cough, hemoptysis and shortness of breath.   Cardiovascular: Negative for palpitations, orthopnea and leg swelling.  Gastrointestinal: Negative for vomiting, abdominal pain and diarrhea.  Genitourinary: Negative for dysuria and urgency.  Musculoskeletal: Negative for myalgias and neck pain.  Skin: Negative for rash.  Neurological: Negative for dizziness, focal weakness, seizures, weakness and headaches.  Psychiatric/Behavioral: Negative for memory loss. The patient does not have insomnia.     Nutrition: Tolerating Diet: Tolerating PT:      DRUG ALLERGIES:   Allergies  Allergen Reactions  . Aspirin Other (See Comments)    Stomach irritation  . Codeine     VITALS:  Blood pressure 200/94, pulse 74, temperature 98.3 F (36.8 C), temperature source Oral, resp. rate 20, height 5\' 2"  (1.575 m), weight 86.41 kg (190 lb 8 oz), SpO2 90 %.  PHYSICAL EXAMINATION:   Physical Exam  GENERAL:  81 y.o.-year-old patient lying in the bed with no acute distress.  EYES: Pupils equal, round, reactive to light and accommodation. No scleral icterus. Extraocular muscles intact.  HEENT: Head atraumatic, normocephalic. Oropharynx and nasopharynx clear.  NECK:  Supple, no jugular venous distention. No thyroid enlargement, no tenderness.  LUNGS: Normal breath sounds bilaterally, no wheezing, rales,rhonchi or crepitation. No use of accessory muscles of respiration.   CARDIOVASCULAR: S1, S2 normal. No murmurs, rubs, or gallops.  ABDOMEN: Soft, nontender, nondistended. Bowel sounds present. No organomegaly or mass.  EXTREMITIES: No pedal edema, cyanosis, or clubbing.  NEUROLOGIC: Cranial nerves II through XII are intact. Muscle strength 5/5 in all extremities. Sensation intact. Gait not checked.  PSYCHIATRIC: The patient is alert and oriented x 3.  SKIN: No obvious rash, lesion, or ulcer.    LABORATORY PANEL:   CBC  Recent Labs Lab 01/26/15 0325  WBC 9.6  HGB 13.2  HCT 38.6  PLT 214   ------------------------------------------------------------------------------------------------------------------  Chemistries   Recent Labs Lab 01/25/15 1032  01/26/15 0841  NA 123*  < > 133*  K 3.9  < > 3.6  CL 89*  < > 100*  CO2 24  < > 26  GLUCOSE 160*  < > 106*  BUN 16  < > 13  CREATININE 0.63  < > 0.62  CALCIUM 8.1*  < > 8.2*  AST 21  --   --   ALT 11*  --   --   ALKPHOS 62  --   --   BILITOT 1.1  --   --   < > = values in this interval not displayed. ------------------------------------------------------------------------------------------------------------------  Cardiac Enzymes  Recent Labs Lab 01/25/15 1032  TROPONINI <0.03   ------------------------------------------------------------------------------------------------------------------  RADIOLOGY:  No results found.   ASSESSMENT AND PLAN:   Active Problems:   Hyponatremia   Cerebral thrombosis with cerebral infarction  Hyponatremia;resolved  Acute  Stroke;no focal deficit;continue plavix. Near syncope;hold discharge,icontinue iv fluids       All the records are reviewed and case discussed with Care Management/Social Workerr. Management plans discussed with the  patient, family and they are in agreement.  CODE STATUS: full  TOTAL TIME TAKING CARE OF THIS PATIENT: 30  minutes.   POSSIBLE D/C IN  1-2 DAYS, DEPENDING ON CLINICAL  CONDITION.   Katha Hamming M.D on 01/29/2015 at 10:46 PM  Between 7am to 6pm - Pager - [phone]  After 6pm go to [email or web address] - password [email or web address] [facility]  [place] [facility]  Office  [phone]  CC: Primary care physician; No primary care provider on file.

## 2015-01-29 NOTE — Progress Notes (Signed)
PT Cancellation Note  Patient Details Name: Jo Moreno MRN: [id number] DOB: 08-27-1924   Cancelled Treatment:    Reason Eval/Treat Not Completed: Other (comment). Spoke with attending nurse this am who states pt is definitely discharging today. Discussed previous symptoms pt experiencing yesterday. Nursing will monitor before discharge.   Elsie Stain Bishop 01/29/2015, 1:18 PM

## 2015-01-29 NOTE — Progress Notes (Signed)
Discharge instructions explained to pts daughter- declined interpreter. Iv and tele removed. Transported off unit via wheelchair.

## 2015-01-29 NOTE — Progress Notes (Signed)
When standing to get dressed for discharge- pt became very dizzy and started to sweat...near syncopal episode- vs taken- MD paged to make aware- orders to d/c susupended

## 2015-01-30 LAB — CULTURE, BLOOD (ROUTINE X 2)
CULTURE: NO GROWTH
Culture: NO GROWTH

## 2015-01-30 MED ORDER — HYDRALAZINE HCL 25 MG PO TABS: 25.0000 mg | ORAL_TABLET | Freq: Three times a day (TID) | ORAL | Status: AC

## 2015-01-30 NOTE — Discharge Summary (Signed)
Jo Moreno, is a 81 y.o. female  DOB 07-02-1925  MRN [id number].  Admission date:  01/25/2015  Admitting Physician  Ramonita Lab, MD  Discharge Date:  01/30/2015   Primary MD  No primary care provider on file.  Recommendations for primary care physician for things to follow:   Follow-up with her primary doctor in [place].    Admission Diagnosis  Acute hyponatremia [E87.1] Altered mental status, unspecified altered mental status type [R41.82]   Discharge Diagnosis  Acute hyponatremia [E87.1] Altered mental status, unspecified altered mental status type [R41.82]    Active Problems:   Hyponatremia   Cerebral thrombosis with cerebral infarction      Past Medical History  Diagnosis Date  . Arthritis     Past Surgical History  Procedure Laterality Date  . Cholecystectomy    . Abdominal hysterectomy       Discharge cancelled yesterday due to near syncope /dizziness. Today she feels much better.we stopped metoprolol and called in for Hydrallazine for BP issues. Called [facility] and cancelled metoprolol prescription.  History of present illness and  Hospital Course:     Kindly see H&P for history of present illness and admission details, please review complete Labs, Consult reports and Test reports for all details in brief  HPI  from the history and physical done on the day of admission  81 year old female brought in by family secondary to altered mental status generalized weakness. Found to have hyponatremia sodium of 123 on admission  Hospital Course  #1 altered mental status with encephalopathy secondary to hyponatremia and dehydration. Patient received IV fluids with normal saline and the patient's sodium was checked frequently, patient of hyponatremia corrected and her sodium improved to 133. Confusion  also resolved.  #2: Patient has a small right occipital infarct: Patient started on the Plavix, seen by neurology. Recommended  30 day Holter, patient is allergic to aspirin so we'll continue her Plavix. Echocardiogram showed EF more than 50% ,ultrasound of carotids did not showsignificant stenosis. Physical therapy, I recommended home health PT with a rolling walker. Patient is visiting.r from [place]. Patient is planning to go back to [facility]  Where her primary doctor  Is.called her  primary doctor Dr. Iran Planas at [phone] and left a message to call us back.  #3 hyperlipidemia with LDL of more than 101 started on simvastatin. Number for constipation; use stool softeners as needed.  Discharge Condition: stable   Follow UP      Follow-up Information    Follow up with [facility], HEMANG K, MD. Go in 1 week.   Specialty:  Neurology   Why:  First available Dr Sherryll Burger Tuesday, September 6that 230pm, ccs   Contact information:   [address] [place] [address] [phone]         Discharge Instructions  and  Discharge Medications        Medication List    TAKE these medications        acetaminophen 325 MG tablet  Commonly known as:  TYLENOL  Take 650 mg by mouth every 6 (six) hours as needed.     atorvastatin 40 MG tablet  Commonly known as:  LIPITOR  Take 1 tablet (40 mg total) by mouth daily.     clopidogrel 75 MG tablet  Commonly known as:  PLAVIX  Take 1 tablet (75 mg total) by mouth daily.     esomeprazole 40 MG capsule  Commonly known as:  NEXIUM  Take 40  mg by mouth daily at 12 noon.     FLOVENT HFA 110 MCG/ACT inhaler  Generic drug:  fluticasone  Inhale 2 puffs into the lungs 2 (two) times daily.     hydrALAZINE 25 MG tablet  Commonly known as:  APRESOLINE  Take 1 tablet (25 mg total) by mouth 3 (three) times daily.     multivitamin capsule  Take 1 capsule by mouth daily.     naproxen 500 MG tablet  Commonly known as:  NAPROSYN  Take  500 mg by mouth 2 (two) times daily with a meal.     PROAIR HFA 108 (90 BASE) MCG/ACT inhaler  Generic drug:  albuterol  Inhale 2 puffs into the lungs 4 (four) times daily as needed.     vitamin B-12 1000 MCG tablet  Commonly known as:  CYANOCOBALAMIN  Take 1,000 mcg by mouth daily.          Diet and Activity recommendation: See Discharge Instructions above   Consults obtained -neurology   Major procedures and Radiology Reports - PLEASE review detailed and final reports for all details, in brief -      Ct Head Wo Contrast  01/25/2015   CLINICAL DATA:  Altered mental status for 1-2 days. Decreased appetite and weakness.  EXAM: CT HEAD WITHOUT CONTRAST  TECHNIQUE: Contiguous axial images were obtained from the base of the skull through the vertex without intravenous contrast.  COMPARISON:  Head CT dated 07/26/2014.  FINDINGS: There is mild generalized brain atrophy with commensurate dilatation of the ventricles and sulci. Chronic small vessel ischemic changes again noted within the bilateral periventricular and subcortical white matter regions.  There is no mass, hemorrhage, edema, or other evidence of acute parenchymal abnormality. No extra-axial hemorrhage. No osseous abnormality. Visualized upper paranasal sinuses are clear. Mastoid air cells are clear.  IMPRESSION: 1. Atrophy and chronic ischemic changes as detailed above. 2. No evidence of acute intracranial abnormality. No intracranial mass, hemorrhage, or edema.   Electronically Signed   By: Bary Richard M.D.   On: 01/25/2015 11:26   Mr [name] WU Contrast  01/26/2015   CLINICAL DATA:  81 year old female with generalized weakness and altered mental status. Subsequent encounter.  EXAM: MRI HEAD WITHOUT AND WITH CONTRAST  TECHNIQUE: Multiplanar, multiecho pulse sequences of the brain and surrounding structures were obtained without and with intravenous contrast.  CONTRAST:  18mL MULTIHANCE GADOBENATE DIMEGLUMINE 529 MG/ML  IV SOLN  COMPARISON:  01/25/2015 CT.  No comparison  FINDINGS: Fast technique imaging had to be utilized.  Acute small nonhemorrhagic parietal -occipital infarcts.  Moderate small vessel disease type changes.  No intracranial hemorrhage.  No hydrocephalus.  No intracranial mass.  Asymmetric enhancement left middle cerebral artery vessels may indicate result of proximal stenosis.  Hyperostosis frontalis interna incidentally noted.  Post lens replacement otherwise orbital structures unremarkable.  Cervical spondylotic changes C3-4.  IMPRESSION: Fast technique imaging had to be utilized.  Acute small nonhemorrhagic parietal -occipital infarcts.  Moderate small vessel disease type changes.  Asymmetric enhancement left middle cerebral artery vessels may indicate result of proximal stenosis.   Electronically Signed   By: Lacy Duverney M.D.   On: 01/26/2015 14:24   US Carotid Bilateral  01/27/2015   CLINICAL DATA:  Acute cerebral infarction.  EXAM: BILATERAL CAROTID DUPLEX ULTRASOUND  TECHNIQUE: Wallace Cullens scale imaging, color Doppler and duplex ultrasound were performed of bilateral carotid and vertebral arteries in the neck.  COMPARISON:  None.  FINDINGS: Criteria: Quantification of carotid  stenosis is based on velocity parameters that correlate the residual internal carotid diameter with NASCET-based stenosis levels, using the diameter of the distal internal carotid lumen as the denominator for stenosis measurement.  The following velocity measurements were obtained:  RIGHT  ICA:  81/20 cm/sec  CCA:  86/12 cm/sec  SYSTOLIC ICA/CCA RATIO:  0.9  DIASTOLIC ICA/CCA RATIO:  1.8  ECA:  110 cm/sec  LEFT  ICA:  68/21 cm/sec  CCA:  85/13 cm/sec  SYSTOLIC ICA/CCA RATIO:  1.1  DIASTOLIC ICA/CCA RATIO:  0.2  ECA:  87 cm/sec  RIGHT CAROTID ARTERY: There is a mild amount of partially calcified plaque at the level of the distal common carotid artery and carotid bulb. A mild amount of calcified plaque extends into the right ICA origin.  Velocities and waveforms are normal and estimated right ICA stenosis is less than 50%. The right internal carotid artery is moderately tortuous in the neck.  RIGHT VERTEBRAL ARTERY: Antegrade flow with normal waveform and velocity.  LEFT CAROTID ARTERY: There is a mild amount of partially calcified and calcified plaque in the distal common carotid artery, carotid bulb and proximal ICA. Velocities and waveforms are normal and estimated left ICA stenosis is less than 50%.  LEFT VERTEBRAL ARTERY: Antegrade flow with normal waveform and velocity.  IMPRESSION: Mild amount of plaque at both carotid bifurcations, including proximal internal carotid arteries. Estimated bilateral ICA stenoses are less than 50%   Electronically Signed   By: Irish Lack M.D.   On: 01/27/2015 11:49   Dg Chest Portable 1 View  01/25/2015   CLINICAL DATA:  Constipation, decreased appetite, weakness. Patient also reports nausea and vomiting. Patient also states chest tightness and shortness of breath.  EXAM: PORTABLE CHEST - 1 VIEW  COMPARISON:  None.  FINDINGS: Heart is enlarged. Questionable mild bibasilar interstitial edema. Evaluation of the left lung base is limited by the overlying heart shadow. No large pleural effusion seen. Cannot exclude small left effusion at the costophrenic angle.  IMPRESSION: Cardiomegaly.  No convincing evidence of congestive heart failure.  Perhaps mild bibasilar interstitial edema. Lungs are otherwise clear. No evidence of pneumonia seen. Evaluation of the left lung base limited by the overlying heart shadow.   Electronically Signed   By: Bary Richard M.D.   On: 01/25/2015 11:20    Micro Results     Recent Results (from the past 240 hour(s))  Urine culture     Status: None   Collection Time: 01/25/15 11:51 AM  Result Value Ref Range Status   Specimen Description URINE, RANDOM  Final   Special Requests Normal  Final   Culture NO GROWTH 2 DAYS  Final   Report Status 01/27/2015 FINAL  Final   Culture, blood (routine x 2)     Status: None   Collection Time: 01/25/15 12:45 PM  Result Value Ref Range Status   Specimen Description BLOOD LEFT FATTY CASTS  Final   Special Requests   Final    BOTTLES DRAWN AEROBIC AND ANAEROBIC  CANT SEE VOLUME   Culture NO GROWTH 5 DAYS  Final   Report Status 01/30/2015 FINAL  Final  Culture, blood (routine x 2)     Status: None   Collection Time: 01/25/15 12:50 PM  Result Value Ref Range Status   Specimen Description BLOOD LEFT ASSIST CONTROL  Final   Special Requests   Final    BOTTLES DRAWN AEROBIC AND ANAEROBIC  CANT SEE VOLUME   Culture NO GROWTH 5 DAYS  Final   Report Status 01/30/2015 FINAL  Final       Today   Subjective:   Jo Moreno today has no headache,no chest abdominal pain,no new weakness tingling or numbness, feels much better wants to go home today.  Objective:   Blood pressure 170/76, pulse 72, temperature 98.9 F (37.2 C), temperature source Oral, resp. rate 20, height 5\' 2"  (1.575 m), weight 86.41 kg (190 lb 8 oz), SpO2 93 %.   Intake/Output Summary (Last 24 hours) at 01/30/15 1019 Last data filed at 01/30/15 0336  Gross per 24 hour  Intake    480 ml  Output    500 ml  Net    -20 ml    Exam Awake Alert, Oriented x 3, No new F.N deficits, Normal affect [place].AT,PERRAL Supple Neck,No JVD, No cervical lymphadenopathy appriciated.  Symmetrical Chest wall movement, Good air movement bilaterally, CTAB RRR,No Gallops,Rubs or new Murmurs, No Parasternal Heave +ve B.Sounds, Abd Soft, Non tender, No organomegaly appriciated, No rebound -guarding or rigidity. No Cyanosis, Clubbing or edema, No new Rash or bruise  Data Review   CBC w Diff:  Lab Results  Component Value Date   WBC 9.6 01/26/2015   HGB 13.2 01/26/2015   HCT 38.6 01/26/2015   PLT 214 01/26/2015   LYMPHOPCT 28 07/26/2014   MONOPCT 7 07/26/2014   EOSPCT 1 07/26/2014   BASOPCT 0 07/26/2014    CMP:  Lab Results  Component Value Date   NA 133*  01/26/2015   K 3.6 01/26/2015   CL 100* 01/26/2015   CO2 26 01/26/2015   BUN 13 01/26/2015   CREATININE 0.62 01/26/2015   PROT 7.4 01/25/2015   ALBUMIN 3.7 01/25/2015   BILITOT 1.1 01/25/2015   ALKPHOS 62 01/25/2015   AST 21 01/25/2015   ALT 11* 01/25/2015  .   Total Time in preparing paper work, data evaluation and todays exam - 35 minutes  Gisselle Galvis M.D on 01/30/2015 at 10:19 AM

## 2015-01-30 NOTE — Progress Notes (Signed)
Dr. Gwenlyn Found made aware of high BP

## 2015-01-30 NOTE — Care Management Important Message (Signed)
Important Message  Patient Details  Name: Annalycia Done MRN: [id number] Date of Birth: 1925/04/17   Medicare Important Message Given:  Yes-third notification given    Olegario Messier A Allmond 01/30/2015, 11:05 AM

## 2015-01-30 NOTE — Progress Notes (Signed)
Discharge instructions explained to pts daughter- iv and tele removed/ transported off unit via wheelchair.

## 2016-01-16 ENCOUNTER — Encounter: Payer: Self-pay | Admitting: Emergency Medicine

## 2016-01-16 ENCOUNTER — Emergency Department: Payer: Medicare Other

## 2016-01-16 ENCOUNTER — Emergency Department
Admission: EM | Admit: 2016-01-16 | Discharge: 2016-01-16 | Disposition: A | Discharge status: Home / Self Care | Payer: Medicare Other | Attending: Emergency Medicine | Admitting: Emergency Medicine

## 2016-01-16 DIAGNOSIS — R51 Headache: Secondary | ICD-10-CM | POA: Diagnosis not present

## 2016-01-16 DIAGNOSIS — Z79899 Other long term (current) drug therapy: Secondary | ICD-10-CM | POA: Insufficient documentation

## 2016-01-16 DIAGNOSIS — Z7984 Long term (current) use of oral hypoglycemic drugs: Secondary | ICD-10-CM | POA: Diagnosis not present

## 2016-01-16 DIAGNOSIS — Z8673 Personal history of transient ischemic attack (TIA), and cerebral infarction without residual deficits: Secondary | ICD-10-CM | POA: Insufficient documentation

## 2016-01-16 DIAGNOSIS — M1990 Unspecified osteoarthritis, unspecified site: Secondary | ICD-10-CM | POA: Insufficient documentation

## 2016-01-16 DIAGNOSIS — R519 Headache, unspecified: Secondary | ICD-10-CM

## 2016-01-16 HISTORY — DX: Cerebral infarction, unspecified: I63.9

## 2016-01-16 LAB — URINALYSIS COMPLETE WITH MICROSCOPIC (ARMC ONLY)
Bacteria, UA: NONE SEEN
Bilirubin Urine: NEGATIVE
Glucose, UA: NEGATIVE mg/dL
HGB URINE DIPSTICK: NEGATIVE
Ketones, ur: NEGATIVE mg/dL
Leukocytes, UA: NEGATIVE
Nitrite: NEGATIVE
PH: 6 (ref 5.0–8.0)
Protein, ur: NEGATIVE mg/dL
Specific Gravity, Urine: 1.015 (ref 1.005–1.030)

## 2016-01-16 LAB — BASIC METABOLIC PANEL
Anion gap: 5 (ref 5–15)
BUN: 11 mg/dL (ref 6–20)
CALCIUM: 8.9 mg/dL (ref 8.9–10.3)
CO2: 34 mmol/L — ABNORMAL HIGH (ref 22–32)
Chloride: 99 mmol/L — ABNORMAL LOW (ref 101–111)
Creatinine, Ser: 0.56 mg/dL (ref 0.44–1.00)
GFR calc Af Amer: >60 mL/min (ref >60)
GFR calc non Af Amer: >60 mL/min (ref >60)
GLUCOSE: 106 mg/dL — AB (ref 65–99)
Potassium: 4.3 mmol/L (ref 3.5–5.1)
Sodium: 138 mmol/L (ref 135–145)

## 2016-01-16 LAB — CBC
HEMATOCRIT: 38.3 % (ref 35.0–47.0)
Hemoglobin: 13.1 g/dL (ref 12.0–16.0)
MCH: 29.4 pg (ref 26.0–34.0)
MCHC: 34.2 g/dL (ref 32.0–36.0)
MCV: 85.9 fL (ref 80.0–100.0)
PLATELETS: 220 10*3/uL (ref 150–440)
RBC: 4.46 MIL/uL (ref 3.80–5.20)
RDW: 14 % (ref 11.5–14.5)
WBC: 6.7 10*3/uL (ref 3.6–11.0)

## 2016-01-16 MED ORDER — ONDANSETRON 4 MG PO TBDP: 4.0000 mg | ORAL_TABLET | Freq: Three times a day (TID) | ORAL | Status: AC | PRN

## 2016-01-16 MED ORDER — METOCLOPRAMIDE HCL 10 MG PO TABS
10.0000 mg | ORAL_TABLET | Freq: Once | ORAL | Status: AC
  Administered 2016-01-16: 10 mg via ORAL
  Filled 2016-01-16: qty 1

## 2016-01-16 MED ORDER — NAPROXEN 500 MG PO TABS
250.0000 mg | ORAL_TABLET | Freq: Once | ORAL | Status: AC
  Administered 2016-01-16: 250 mg via ORAL
  Filled 2016-01-16: qty 1

## 2016-01-16 NOTE — ED Provider Notes (Signed)
[facility] Emergency Department Provider Note  ____________________________________________  Time seen: 3:20 PM  I have reviewed the triage vital signs and the nursing notes.   HISTORY  Chief Complaint Headache History obtained from patient and daughter, daughter acted as which is interpreted patient's request   HPI Jo Moreno is a 82 y.o. female who complains of generalized headache for 3 days. Gradual onset, feels like pressure on bilateral parietal and frontal areas. No vision changes numbness tingling or weakness. Walking normally with a cane. No falls or head trauma. No fever chills chest pain shortness breath belly pain or dysuria. Eating and drinking normally. He has been visiting her daughter for 3 weeks, normal activity during that time.     Past Medical History  Diagnosis Date  . Arthritis   . Stroke [facility])      Patient Active Problem List   Diagnosis Date Noted  . Cerebral thrombosis with cerebral infarction (HCC) 01/28/2015  . Hyponatremia 01/25/2015     Past Surgical History  Procedure Laterality Date  . Cholecystectomy    . Abdominal hysterectomy       Current Outpatient Rx  Name  Route  Sig  Dispense  Refill  . acetaminophen (TYLENOL) 325 MG tablet   Oral   Take 650 mg by mouth every 6 (six) hours as needed.         Marland Kitchen atorvastatin (LIPITOR) 40 MG tablet   Oral   Take 1 tablet (40 mg total) by mouth daily.   30 tablet   0   . clopidogrel (PLAVIX) 75 MG tablet   Oral   Take 1 tablet (75 mg total) by mouth daily.   30 tablet   0   . esomeprazole (NEXIUM) 40 MG capsule   Oral   Take 40 mg by mouth daily at 12 noon.         Marland Kitchen FLOVENT HFA 110 MCG/ACT inhaler   Inhalation   Inhale 2 puffs into the lungs 2 (two) times daily.      0     Dispense as written.   . hydrALAZINE (APRESOLINE) 25 MG tablet   Oral   Take 1 tablet (25 mg total) by mouth 3 (three) times daily.   60 tablet   0   . metFORMIN  (GLUCOPHAGE-XR) 500 MG 24 hr tablet   Oral   Take 500 mg by mouth daily.      0   . Multiple Vitamin (MULTIVITAMIN) capsule   Oral   Take 1 capsule by mouth daily.         . sertraline (ZOLOFT) 25 MG tablet   Oral   Take 25 mg by mouth daily.      0   . vitamin B-12 (CYANOCOBALAMIN) 1000 MCG tablet   Oral   Take 1,000 mcg by mouth daily.         . ondansetron (ZOFRAN ODT) 4 MG disintegrating tablet   Oral   Take 1 tablet (4 mg total) by mouth every 8 (eight) hours as needed for nausea or vomiting.   20 tablet   0   . PROAIR HFA 108 (90 BASE) MCG/ACT inhaler   Inhalation   Inhale 2 puffs into the lungs 4 (four) times daily as needed.      0     Dispense as written.      Allergies Aspirin and Codeine   History reviewed. No pertinent family history.  Social History Social History  Substance Use Topics  .  Smoking status: Never Smoker   . Smokeless tobacco: Never Used  . Alcohol Use: Yes     Comment: occasional wine    Review of Systems  Constitutional:   No fever or chills.  ENT:   No sore throat. No rhinorrhea. Cardiovascular:   No chest pain. Respiratory:   No dyspnea or cough. Gastrointestinal:   Negative for abdominal pain, vomiting and diarrhea.  Genitourinary:   Negative for dysuria or difficulty urinating. Musculoskeletal:   Negative for focal pain or swelling Neurological:  Positive for headache as above 10-point ROS otherwise negative.  ____________________________________________   PHYSICAL EXAM:  VITAL SIGNS: ED Triage Vitals  Enc Vitals Group     BP 01/16/16 1330 132/89 mmHg     Pulse Rate 01/16/16 1536 66     Resp 01/16/16 1330 18     Temp 01/16/16 1330 98.7 F (37.1 C)     Temp Source 01/16/16 1330 Oral     SpO2 01/16/16 1330 98 %     Weight 01/16/16 1330 174 lb (78.926 kg)     Height 01/16/16 1330 5\' 2"  (1.575 m)     Head Cir --      Peak Flow --      Pain Score 01/16/16 1331 10     Pain Loc --      Pain Edu? --       Excl. in GC? --     Vital signs reviewed, nursing assessments reviewed.   Constitutional:   Alert and oriented. Well appearing and in no distress. Eyes:   No scleral icterus. No conjunctival pallor. PERRL. EOMI.  No nystagmus. ENT   Head:   Normocephalic and atraumatic.   Nose:   No congestion/rhinnorhea. No septal hematoma   Mouth/Throat:   MMM, no pharyngeal erythema. No peritonsillar mass.    Neck:   No stridor. No SubQ emphysema. No meningismus. Hematological/Lymphatic/Immunilogical:   No cervical lymphadenopathy. Cardiovascular:   RRR. Symmetric bilateral radial and DP pulses.  No murmurs.  Respiratory:   Normal respiratory effort without tachypnea nor retractions. Breath sounds are clear and equal bilaterally. No wheezes/rales/rhonchi. Gastrointestinal:   Soft and nontender. Non distended. There is no CVA tenderness.  No rebound, rigidity, or guarding. Genitourinary:   deferred Musculoskeletal:   Nontender with normal range of motion in all extremities. No joint effusions.  No lower extremity tenderness.  No edema. Neurologic:   Normal speech and language.  CN 2-10 normal. Motor grossly intact. Normal finger to nose, no pronator drift. No gross focal neurologic deficits are appreciated.  Skin:    Skin is warm, dry and intact. No rash noted.  No petechiae, purpura, or bullae.  ____________________________________________    LABS (pertinent positives/negatives) (all labs ordered are listed, but only abnormal results are displayed) Labs Reviewed  BASIC METABOLIC PANEL - Abnormal; Notable for the following:    Chloride 99 (*)    CO2 34 (*)    Glucose, Bld 106 (*)    All other components within normal limits  URINALYSIS COMPLETEWITH MICROSCOPIC ([facility] ONLY) - Abnormal; Notable for the following:    Color, Urine YELLOW (*)    APPearance CLEAR (*)    Squamous Epithelial / LPF 0-5 (*)    All other components within normal limits  URINE CULTURE  CBC    ____________________________________________   EKG    ____________________________________________    RADIOLOGY  CT head unremarkable  ____________________________________________   PROCEDURES   ____________________________________________   INITIAL IMPRESSION / ASSESSMENT AND PLAN /  ED COURSE  Pertinent labs & imaging results that were available during my care of the patient were reviewed by me and considered in my medical decision making (see chart for details).  Patient presents with generalized headache, nonspecific, benign pattern. She has frequent headaches and this is within the range of normal for her. No other acute concerning symptoms. Vital signs and workup unremarkable. Patient given low-dose naproxen and Reglan with resolution of symptoms. Patient feeling much better and wanting to go home. We'll discharge home to follow up with primary care.Considering the patient's symptoms, medical history, and physical examination today, I have low suspicion for ischemic stroke, intracranial hemorrhage, meningitis, encephalitis, carotid or vertebral dissection, venous sinus thrombosis, MS, intracranial hypertension, glaucoma, CRAO, CRVO, or temporal arteritis.      ____________________________________________   FINAL CLINICAL IMPRESSION(S) / ED DIAGNOSES  Final diagnoses:  Acute nonintractable headache, unspecified headache type       Portions of this note were generated with dragon dictation software. Dictation errors may occur despite best attempts at proofreading.   Sharman CheekPhillip Climmie Buelow, MD 01/16/16 [phone]

## 2016-01-16 NOTE — ED Notes (Signed)
Daughter states pt has chronic headaches since stroke 1 year ago. Is not on meds for migraines. This headache x 2 days. Uses a cane to ambulate. Strong grasps, strong push pulls, no neuro deficits appreciated.

## 2016-01-18 LAB — URINE CULTURE

## 2016-07-24 ENCOUNTER — Emergency Department: Payer: Medicare Other

## 2016-07-24 ENCOUNTER — Inpatient Hospital Stay
Admission: EM | Admit: 2016-07-24 | Discharge: 2016-07-30 | DRG: 871 | Disposition: A | Discharge status: Home Health | Payer: Medicare Other | Attending: Internal Medicine | Admitting: Internal Medicine

## 2016-07-24 ENCOUNTER — Encounter: Payer: Self-pay | Admitting: Internal Medicine

## 2016-07-24 DIAGNOSIS — Z90710 Acquired absence of both cervix and uterus: Secondary | ICD-10-CM | POA: Diagnosis not present

## 2016-07-24 DIAGNOSIS — A419 Sepsis, unspecified organism: Secondary | ICD-10-CM | POA: Diagnosis not present

## 2016-07-24 DIAGNOSIS — E1165 Type 2 diabetes mellitus with hyperglycemia: Secondary | ICD-10-CM | POA: Diagnosis present

## 2016-07-24 DIAGNOSIS — K219 Gastro-esophageal reflux disease without esophagitis: Secondary | ICD-10-CM | POA: Diagnosis present

## 2016-07-24 DIAGNOSIS — I248 Other forms of acute ischemic heart disease: Secondary | ICD-10-CM | POA: Diagnosis present

## 2016-07-24 DIAGNOSIS — B370 Candidal stomatitis: Secondary | ICD-10-CM | POA: Diagnosis present

## 2016-07-24 DIAGNOSIS — J101 Influenza due to other identified influenza virus with other respiratory manifestations: Secondary | ICD-10-CM | POA: Diagnosis not present

## 2016-07-24 DIAGNOSIS — R6510 Systemic inflammatory response syndrome (SIRS) of non-infectious origin without acute organ dysfunction: Secondary | ICD-10-CM

## 2016-07-24 DIAGNOSIS — Z7902 Long term (current) use of antithrombotics/antiplatelets: Secondary | ICD-10-CM | POA: Diagnosis not present

## 2016-07-24 DIAGNOSIS — Z9049 Acquired absence of other specified parts of digestive tract: Secondary | ICD-10-CM | POA: Diagnosis not present

## 2016-07-24 DIAGNOSIS — M6281 Muscle weakness (generalized): Secondary | ICD-10-CM

## 2016-07-24 DIAGNOSIS — Z886 Allergy status to analgesic agent status: Secondary | ICD-10-CM | POA: Diagnosis not present

## 2016-07-24 DIAGNOSIS — Z885 Allergy status to narcotic agent status: Secondary | ICD-10-CM | POA: Diagnosis not present

## 2016-07-24 DIAGNOSIS — Z8673 Personal history of transient ischemic attack (TIA), and cerebral infarction without residual deficits: Secondary | ICD-10-CM

## 2016-07-24 DIAGNOSIS — J9601 Acute respiratory failure with hypoxia: Secondary | ICD-10-CM | POA: Diagnosis present

## 2016-07-24 DIAGNOSIS — R0602 Shortness of breath: Secondary | ICD-10-CM | POA: Diagnosis present

## 2016-07-24 DIAGNOSIS — I5033 Acute on chronic diastolic (congestive) heart failure: Secondary | ICD-10-CM | POA: Diagnosis present

## 2016-07-24 DIAGNOSIS — Z7984 Long term (current) use of oral hypoglycemic drugs: Secondary | ICD-10-CM

## 2016-07-24 DIAGNOSIS — J1000 Influenza due to other identified influenza virus with unspecified type of pneumonia: Secondary | ICD-10-CM | POA: Diagnosis present

## 2016-07-24 DIAGNOSIS — R748 Abnormal levels of other serum enzymes: Secondary | ICD-10-CM | POA: Diagnosis not present

## 2016-07-24 DIAGNOSIS — M1990 Unspecified osteoarthritis, unspecified site: Secondary | ICD-10-CM | POA: Diagnosis present

## 2016-07-24 DIAGNOSIS — R7989 Other specified abnormal findings of blood chemistry: Secondary | ICD-10-CM

## 2016-07-24 DIAGNOSIS — J9801 Acute bronchospasm: Secondary | ICD-10-CM | POA: Diagnosis present

## 2016-07-24 DIAGNOSIS — I509 Heart failure, unspecified: Secondary | ICD-10-CM

## 2016-07-24 DIAGNOSIS — J189 Pneumonia, unspecified organism: Secondary | ICD-10-CM | POA: Diagnosis present

## 2016-07-24 DIAGNOSIS — R0902 Hypoxemia: Secondary | ICD-10-CM

## 2016-07-24 DIAGNOSIS — J1100 Influenza due to unidentified influenza virus with unspecified type of pneumonia: Secondary | ICD-10-CM | POA: Diagnosis present

## 2016-07-24 DIAGNOSIS — R0600 Dyspnea, unspecified: Secondary | ICD-10-CM | POA: Diagnosis not present

## 2016-07-24 DIAGNOSIS — R778 Other specified abnormalities of plasma proteins: Secondary | ICD-10-CM | POA: Diagnosis present

## 2016-07-24 HISTORY — DX: Type 2 diabetes mellitus without complications: E11.9

## 2016-07-24 LAB — COMPREHENSIVE METABOLIC PANEL
ALBUMIN: 3.6 g/dL (ref 3.5–5.0)
ALT: 30 U/L (ref 14–54)
AST: 50 U/L — AB (ref 15–41)
Alkaline Phosphatase: 54 U/L (ref 38–126)
Anion gap: 8 (ref 5–15)
BUN: 12 mg/dL (ref 6–20)
CHLORIDE: 97 mmol/L — AB (ref 101–111)
CO2: 30 mmol/L (ref 22–32)
CREATININE: 0.69 mg/dL (ref 0.44–1.00)
Calcium: 8.1 mg/dL — ABNORMAL LOW (ref 8.9–10.3)
GFR calc Af Amer: >60 mL/min (ref >60)
GFR calc non Af Amer: >60 mL/min (ref >60)
GLUCOSE: 227 mg/dL — AB (ref 65–99)
Potassium: 3.8 mmol/L (ref 3.5–5.1)
SODIUM: 135 mmol/L (ref 135–145)
Total Bilirubin: 0.3 mg/dL (ref 0.3–1.2)
Total Protein: 7.5 g/dL (ref 6.5–8.1)

## 2016-07-24 LAB — CBC WITH DIFFERENTIAL/PLATELET
Basophils Absolute: 0 10*3/uL (ref 0–0.1)
Basophils Relative: 0 %
EOS PCT: 0 %
Eosinophils Absolute: 0 10*3/uL (ref 0–0.7)
HCT: 38.3 % (ref 35.0–47.0)
Hemoglobin: 13.1 g/dL (ref 12.0–16.0)
LYMPHS ABS: 0.4 10*3/uL — AB (ref 1.0–3.6)
LYMPHS PCT: 6 %
MCH: 29.1 pg (ref 26.0–34.0)
MCHC: 34.2 g/dL (ref 32.0–36.0)
MCV: 85.1 fL (ref 80.0–100.0)
MONO ABS: 0.4 10*3/uL (ref 0.2–0.9)
MONOS PCT: 6 %
Neutro Abs: 6.2 10*3/uL (ref 1.4–6.5)
Neutrophils Relative %: 88 %
PLATELETS: 178 10*3/uL (ref 150–440)
RBC: 4.5 MIL/uL (ref 3.80–5.20)
RDW: 13.9 % (ref 11.5–14.5)
WBC: 7 10*3/uL (ref 3.6–11.0)

## 2016-07-24 LAB — GLUCOSE, CAPILLARY
Glucose-Capillary: 155 mg/dL — ABNORMAL HIGH (ref 65–99)
Glucose-Capillary: 167 mg/dL — ABNORMAL HIGH (ref 65–99)

## 2016-07-24 LAB — TSH: TSH: 1.134 u[IU]/mL (ref 0.350–4.500)

## 2016-07-24 LAB — URINALYSIS, COMPLETE (UACMP) WITH MICROSCOPIC
BILIRUBIN URINE: NEGATIVE
Glucose, UA: 50 mg/dL — AB
Ketones, ur: NEGATIVE mg/dL
Leukocytes, UA: NEGATIVE
NITRITE: NEGATIVE
Protein, ur: 100 mg/dL — AB
Specific Gravity, Urine: 1.02 (ref 1.005–1.030)
pH: 5 (ref 5.0–8.0)

## 2016-07-24 LAB — LACTIC ACID, PLASMA: Lactic Acid, Venous: 1.6 mmol/L (ref 0.5–1.9)

## 2016-07-24 LAB — PROTIME-INR
INR: 1.17
Prothrombin Time: 15 seconds (ref 11.4–15.2)

## 2016-07-24 LAB — BRAIN NATRIURETIC PEPTIDE: B Natriuretic Peptide: 124 pg/mL — ABNORMAL HIGH (ref 0.0–100.0)

## 2016-07-24 LAB — TROPONIN I
TROPONIN I: 0.43 ng/mL — AB (ref <0.03)
Troponin I: 0.89 ng/mL (ref <0.03)

## 2016-07-24 LAB — INFLUENZA PANEL BY PCR (TYPE A & B)
Influenza A By PCR: POSITIVE — AB
Influenza B By PCR: NEGATIVE

## 2016-07-24 MED ORDER — SODIUM CHLORIDE 0.9% FLUSH
3.0000 mL | Freq: Two times a day (BID) | INTRAVENOUS | Status: DC
  Administered 2016-07-24 – 2016-07-29 (×11): 3 mL via INTRAVENOUS

## 2016-07-24 MED ORDER — ACETAMINOPHEN 325 MG PO TABS
650.0000 mg | ORAL_TABLET | Freq: Once | ORAL | Status: AC
  Administered 2016-07-24: 650 mg via ORAL
  Filled 2016-07-24: qty 2

## 2016-07-24 MED ORDER — ASPIRIN 81 MG PO CHEW
324.0000 mg | CHEWABLE_TABLET | Freq: Once | ORAL | Status: AC
  Administered 2016-07-24: 324 mg via ORAL
  Filled 2016-07-24: qty 4

## 2016-07-24 MED ORDER — IPRATROPIUM-ALBUTEROL 0.5-2.5 (3) MG/3ML IN SOLN
RESPIRATORY_TRACT | Status: AC
  Administered 2016-07-24: 3 mL via RESPIRATORY_TRACT
  Filled 2016-07-24: qty 6

## 2016-07-24 MED ORDER — ACETAMINOPHEN 325 MG PO TABS
ORAL_TABLET | ORAL | Status: AC
  Filled 2016-07-24: qty 2

## 2016-07-24 MED ORDER — OSELTAMIVIR PHOSPHATE 30 MG PO CAPS
30.0000 mg | ORAL_CAPSULE | Freq: Two times a day (BID) | ORAL | Status: AC
Start: 2016-07-25 — End: 2016-07-28
  Administered 2016-07-25 – 2016-07-28 (×8): 30 mg via ORAL
  Filled 2016-07-24 (×8): qty 1

## 2016-07-24 MED ORDER — IPRATROPIUM-ALBUTEROL 0.5-2.5 (3) MG/3ML IN SOLN
3.0000 mL | Freq: Once | RESPIRATORY_TRACT | Status: AC
  Administered 2016-07-24: 3 mL via RESPIRATORY_TRACT

## 2016-07-24 MED ORDER — INSULIN ASPART 100 UNIT/ML ~~LOC~~ SOLN
0.0000 [IU] | Freq: Three times a day (TID) | SUBCUTANEOUS | Status: DC
  Administered 2016-07-25: 1 [IU] via SUBCUTANEOUS
  Administered 2016-07-25 (×2): 2 [IU] via SUBCUTANEOUS
  Administered 2016-07-26: 7 [IU] via SUBCUTANEOUS
  Administered 2016-07-26: 2 [IU] via SUBCUTANEOUS
  Administered 2016-07-26: 3 [IU] via SUBCUTANEOUS
  Administered 2016-07-27: 2 [IU] via SUBCUTANEOUS
  Administered 2016-07-27: 5 [IU] via SUBCUTANEOUS
  Filled 2016-07-24: qty 6
  Filled 2016-07-24 (×3): qty 2
  Filled 2016-07-24: qty 1
  Filled 2016-07-24: qty 3
  Filled 2016-07-24: qty 5
  Filled 2016-07-24: qty 2
  Filled 2016-07-24: qty 7

## 2016-07-24 MED ORDER — ACETAMINOPHEN 325 MG PO TABS
650.0000 mg | ORAL_TABLET | Freq: Four times a day (QID) | ORAL | Status: DC | PRN
  Administered 2016-07-24 – 2016-07-26 (×2): 650 mg via ORAL
  Filled 2016-07-24: qty 2

## 2016-07-24 MED ORDER — GUAIFENESIN 100 MG/5ML PO SOLN
15.0000 mL | Freq: Four times a day (QID) | ORAL | Status: DC | PRN
  Administered 2016-07-27 – 2016-07-29 (×5): 300 mg via ORAL
  Filled 2016-07-24 (×7): qty 15

## 2016-07-24 MED ORDER — IPRATROPIUM-ALBUTEROL 0.5-2.5 (3) MG/3ML IN SOLN
3.0000 mL | Freq: Once | RESPIRATORY_TRACT | Status: AC
  Administered 2016-07-24: 3 mL via RESPIRATORY_TRACT
  Filled 2016-07-24: qty 3

## 2016-07-24 MED ORDER — LEVOFLOXACIN 500 MG PO TABS
500.0000 mg | ORAL_TABLET | Freq: Once | ORAL | Status: AC
  Administered 2016-07-24: 500 mg via ORAL
  Filled 2016-07-24: qty 1

## 2016-07-24 MED ORDER — SODIUM CHLORIDE 0.9 % IV SOLN: 250.0000 mL | INTRAVENOUS | Status: DC | PRN

## 2016-07-24 MED ORDER — OSELTAMIVIR PHOSPHATE 75 MG PO CAPS: 75.0000 mg | ORAL_CAPSULE | Freq: Two times a day (BID) | ORAL | Status: DC

## 2016-07-24 MED ORDER — PANTOPRAZOLE SODIUM 40 MG PO TBEC
40.0000 mg | DELAYED_RELEASE_TABLET | Freq: Every day | ORAL | Status: DC
  Administered 2016-07-24 – 2016-07-30 (×7): 40 mg via ORAL
  Filled 2016-07-24 (×7): qty 1

## 2016-07-24 MED ORDER — OSELTAMIVIR PHOSPHATE 75 MG PO CAPS
75.0000 mg | ORAL_CAPSULE | Freq: Once | ORAL | Status: AC
  Administered 2016-07-24: 75 mg via ORAL
  Filled 2016-07-24: qty 1

## 2016-07-24 MED ORDER — VITAMIN B-12 1000 MCG PO TABS
1000.0000 ug | ORAL_TABLET | Freq: Every day | ORAL | Status: DC
  Administered 2016-07-24 – 2016-07-30 (×7): 1000 ug via ORAL
  Filled 2016-07-24 (×7): qty 1

## 2016-07-24 MED ORDER — ENOXAPARIN SODIUM 40 MG/0.4ML ~~LOC~~ SOLN
40.0000 mg | SUBCUTANEOUS | Status: DC
  Administered 2016-07-24 – 2016-07-29 (×6): 40 mg via SUBCUTANEOUS
  Filled 2016-07-24 (×6): qty 0.4

## 2016-07-24 MED ORDER — HYDROCODONE-ACETAMINOPHEN 5-325 MG PO TABS
1.0000 | ORAL_TABLET | ORAL | Status: DC | PRN
  Administered 2016-07-24 – 2016-07-29 (×6): 1 via ORAL
  Filled 2016-07-24 (×6): qty 1

## 2016-07-24 MED ORDER — GUAIFENESIN ER 600 MG PO TB12
600.0000 mg | ORAL_TABLET | Freq: Two times a day (BID) | ORAL | Status: DC
  Administered 2016-07-24 – 2016-07-30 (×12): 600 mg via ORAL
  Filled 2016-07-24 (×13): qty 1

## 2016-07-24 MED ORDER — LEVOFLOXACIN 500 MG PO TABS
250.0000 mg | ORAL_TABLET | Freq: Every day | ORAL | Status: DC
  Administered 2016-07-25 – 2016-07-28 (×4): 250 mg via ORAL
  Filled 2016-07-24 (×4): qty 1

## 2016-07-24 MED ORDER — CLOPIDOGREL BISULFATE 75 MG PO TABS
75.0000 mg | ORAL_TABLET | Freq: Every day | ORAL | Status: DC
  Administered 2016-07-24 – 2016-07-30 (×7): 75 mg via ORAL
  Filled 2016-07-24 (×7): qty 1

## 2016-07-24 MED ORDER — ASPIRIN EC 325 MG PO TBEC
325.0000 mg | DELAYED_RELEASE_TABLET | Freq: Every day | ORAL | Status: DC
  Administered 2016-07-25 – 2016-07-30 (×6): 325 mg via ORAL
  Filled 2016-07-24 (×6): qty 1

## 2016-07-24 MED ORDER — SERTRALINE HCL 50 MG PO TABS
25.0000 mg | ORAL_TABLET | Freq: Every day | ORAL | Status: DC
  Administered 2016-07-24 – 2016-07-30 (×7): 25 mg via ORAL
  Filled 2016-07-24 (×7): qty 1

## 2016-07-24 MED ORDER — ONDANSETRON HCL 4 MG PO TABS: 4.0000 mg | ORAL_TABLET | Freq: Four times a day (QID) | ORAL | Status: DC | PRN

## 2016-07-24 MED ORDER — ALBUTEROL SULFATE (2.5 MG/3ML) 0.083% IN NEBU
2.5000 mg | INHALATION_SOLUTION | Freq: Four times a day (QID) | RESPIRATORY_TRACT | Status: DC | PRN
  Administered 2016-07-25: 2.5 mg via RESPIRATORY_TRACT
  Filled 2016-07-24: qty 3

## 2016-07-24 MED ORDER — SODIUM CHLORIDE 0.9 % IV BOLUS (SEPSIS)
1000.0000 mL | Freq: Once | INTRAVENOUS | Status: AC
  Administered 2016-07-24: 1000 mL via INTRAVENOUS

## 2016-07-24 MED ORDER — HYDRALAZINE HCL 25 MG PO TABS
25.0000 mg | ORAL_TABLET | Freq: Three times a day (TID) | ORAL | Status: DC
  Administered 2016-07-24 – 2016-07-30 (×17): 25 mg via ORAL
  Filled 2016-07-24 (×18): qty 1

## 2016-07-24 MED ORDER — SODIUM CHLORIDE 0.9% FLUSH
3.0000 mL | Freq: Two times a day (BID) | INTRAVENOUS | Status: DC
  Administered 2016-07-24 – 2016-07-27 (×3): 3 mL via INTRAVENOUS

## 2016-07-24 MED ORDER — ONDANSETRON 4 MG PO TBDP
4.0000 mg | ORAL_TABLET | Freq: Three times a day (TID) | ORAL | Status: DC | PRN
  Filled 2016-07-24: qty 1

## 2016-07-24 MED ORDER — ONDANSETRON HCL 4 MG/2ML IJ SOLN
4.0000 mg | Freq: Four times a day (QID) | INTRAMUSCULAR | Status: DC | PRN
Start: 2016-07-24 — End: 2016-07-30

## 2016-07-24 MED ORDER — ATORVASTATIN CALCIUM 20 MG PO TABS
40.0000 mg | ORAL_TABLET | Freq: Every day | ORAL | Status: DC
  Administered 2016-07-24 – 2016-07-30 (×7): 40 mg via ORAL
  Filled 2016-07-24 (×8): qty 2

## 2016-07-24 MED ORDER — METFORMIN HCL ER 500 MG PO TB24
500.0000 mg | ORAL_TABLET | Freq: Every day | ORAL | Status: DC
  Administered 2016-07-25 – 2016-07-30 (×6): 500 mg via ORAL
  Filled 2016-07-24 (×6): qty 1

## 2016-07-24 MED ORDER — SODIUM CHLORIDE 0.9% FLUSH
3.0000 mL | INTRAVENOUS | Status: DC | PRN
  Administered 2016-07-26 – 2016-07-30 (×2): 3 mL via INTRAVENOUS
  Filled 2016-07-24 (×2): qty 3

## 2016-07-24 NOTE — Progress Notes (Signed)
Patient is admitted to room 245 with the diagnosis of SOB. Alert and oriented x 4.  Tele box called to CCMD with Marchelle FolksAmanda as a second Training and development officerverifier. Skin assessment done with Crystal B. RN, noted some redness on the abdominal fold and bruise on left upper back. Bilateral dry flaky feet noted as well. Bed alarm activated and the bed is in the lowest position. Daughter at bedside voiced no concerns. Will continue to monitor.

## 2016-07-24 NOTE — H&P (Signed)
[facility] [place] [facility]   PATIENT NAME: Jo Moreno    MR#:  [id number]  DATE OF BIRTH:  Jun 10, 1925  DATE OF ADMISSION:  07/24/2016  PRIMARY CARE PHYSICIAN: No PCP Per Patient   REQUESTING/REFERRING PHYSICIAN: Dr. Governor Rooks  CHIEF COMPLAINT:   Chief Complaint  Patient presents with  . Code Sepsis    HISTORY OF PRESENT ILLNESS: Jo Moreno  is a 83 y.o. female with a known history of  Osteoarthritis, diabetes type 2, CVA was currently visiting her [place] from [place] with oa ,dmii and previous CVA who is presenting with complaint of shortness of breath, generalized body aches and not feeling well. She also had nausea vomiting and diarrhea. She has had fevers as well. In the emergency room patient is noted to be hypoxic noticed to have a troponin that was elevated. She complains of chest pain with coughing.  PAST MEDICAL HISTORY:   Past Medical History:  Diagnosis Date  . Arthritis   . DMII (diabetes mellitus, type 2) (HCC)   . Stroke [facility])     PAST SURGICAL HISTORY: Past Surgical History:  Procedure Laterality Date  . ABDOMINAL HYSTERECTOMY    . CHOLECYSTECTOMY      SOCIAL HISTORY:  Social History  Substance Use Topics  . Smoking status: Never Smoker  . Smokeless tobacco: Never Used  . Alcohol use Yes     Comment: occasional wine    FAMILY HISTORY: No family history on file.  DRUG ALLERGIES:  Allergies  Allergen Reactions  . Aspirin Other (See Comments)    Stomach irritation  . 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    REVIEW OF SYSTEMS:   CONSTITUTIONAL: Positive fever, positive fatigue or positive weakness.  EYES: No blurred or double vision.  EARS, NOSE, AND THROAT: No tinnitus or ear pain.  RESPIRATORY: Positive cough, positive shortness of breath, no wheezing or hemoptysis.  CARDIOVASCULAR: Positive chest pain with coughing, orthopnea,  edema.  GASTROINTESTINAL: No nausea, vomiting, diarrhea or abdominal pain.  GENITOURINARY: No dysuria, hematuria.  ENDOCRINE: No polyuria, nocturia,  HEMATOLOGY: No anemia, easy bruising or bleeding SKIN: No rash or lesion. MUSCULOSKELETAL: No joint pain or arthritis.   NEUROLOGIC: No tingling, numbness, weakness.  PSYCHIATRY: No anxiety or depression.   MEDICATIONS AT HOME:  Prior to Admission medications   Medication Sig Start Date End Date Taking? Authorizing Provider  acetaminophen (TYLENOL) 325 MG tablet Take 650 mg by mouth every 6 (six) hours as needed.   Yes Historical Provider, MD  atorvastatin (LIPITOR) 40 MG tablet Take 1 tablet (40 mg total) by mouth daily. 01/29/15  Yes Katha Hamming, MD  esomeprazole (NEXIUM) 40 MG capsule Take 40 mg by mouth daily at 12 noon.   Yes Historical Provider, MD  hydrALAZINE (APRESOLINE) 25 MG tablet Take 1 tablet (25 mg total) by mouth 3 (three) times daily. 01/30/15  Yes Katha Hamming, MD  metFORMIN (GLUCOPHAGE-XR) 500 MG 24 hr tablet Take 500 mg by mouth daily. 01/02/16  Yes Historical Provider, MD  PROAIR HFA 108 (90 BASE) MCG/ACT inhaler Inhale 2 puffs into the lungs 4 (four) times daily as needed.   Yes Historical Provider, MD  sertraline (ZOLOFT) 25 MG tablet Take 25 mg by mouth daily. 01/02/16  Yes Historical Provider, MD  vitamin B-12 (CYANOCOBALAMIN) 1000 MCG tablet Take 1,000 mcg by mouth daily.   Yes Historical Provider, MD  clopidogrel (PLAVIX)  75 MG tablet Take 1 tablet (75 mg total) by mouth daily. 07/26/14   Rolan Bucco, MD  ondansetron (ZOFRAN ODT) 4 MG disintegrating tablet Take 1 tablet (4 mg total) by mouth every 8 (eight) hours as needed for nausea or vomiting. 01/16/16   Sharman Cheek, MD      PHYSICAL EXAMINATION:   VITAL SIGNS: Blood pressure (!) 119/57, pulse (!) 101, temperature 98.8 F (37.1 C), temperature source Oral, resp. rate (!) 26, SpO2 95 %.  GENERAL:  83 y.o.-year-old patient lying in the bed with  no acute distress.  EYES: Pupils equal, round, reactive to light and accommodation. No scleral icterus. Extraocular muscles intact.  HEENT: Head atraumatic, normocephalic. Oropharynx and nasopharynx clear.  NECK:  Supple, no jugular venous distention. No thyroid enlargement, no tenderness.  LUNGS: Rhonchus breath sounds bilaterally without any wheezing CARDIOVASCULAR: S1, S2 normal. No murmurs, rubs, or gallops.  ABDOMEN: Soft, nontender, nondistended. Bowel sounds present. No organomegaly or mass.  EXTREMITIES: Nonpitting edema, cyanosis, or clubbing.  NEUROLOGIC: Cranial nerves II through XII are intact. Muscle strength 5/5 in all extremities. Sensation intact. Gait not checked.  PSYCHIATRIC: The patient is alert and oriented x 3.  SKIN: No obvious rash, lesion, or ulcer.   LABORATORY PANEL:   CBC  Recent Labs Lab 07/24/16 1115  WBC 7.0  HGB 13.1  HCT 38.3  PLT 178  MCV 85.1  MCH 29.1  MCHC 34.2  RDW 13.9  LYMPHSABS 0.4*  MONOABS 0.4  EOSABS 0.0  BASOSABS 0.0   ------------------------------------------------------------------------------------------------------------------  Chemistries   Recent Labs Lab 07/24/16 1115  NA 135  K 3.8  CL 97*  CO2 30  GLUCOSE 227*  BUN 12  CREATININE 0.69  CALCIUM 8.1*  AST 50*  ALT 30  ALKPHOS 54  BILITOT 0.3   ------------------------------------------------------------------------------------------------------------------ CrCl cannot be calculated (Unknown ideal weight.). ------------------------------------------------------------------------------------------------------------------ No results for input(s): TSH, T4TOTAL, T3FREE, THYROIDAB in the last 72 hours.  Invalid input(s): FREET3   Coagulation profile  Recent Labs Lab 07/24/16 1115  INR 1.17   ------------------------------------------------------------------------------------------------------------------- No results for input(s): DDIMER in the last 72  hours. -------------------------------------------------------------------------------------------------------------------  Cardiac Enzymes  Recent Labs Lab 07/24/16 1115  TROPONINI 0.43*   ------------------------------------------------------------------------------------------------------------------ Invalid input(s): POCBNP  ---------------------------------------------------------------------------------------------------------------  Urinalysis    Component Value Date/Time   COLORURINE YELLOW (A) 07/24/2016 1136   APPEARANCEUR CLEAR (A) 07/24/2016 1136   LABSPEC 1.020 07/24/2016 1136   PHURINE 5.0 07/24/2016 1136   GLUCOSEU 50 (A) 07/24/2016 1136   HGBUR SMALL (A) 07/24/2016 1136   BILIRUBINUR NEGATIVE 07/24/2016 1136   KETONESUR NEGATIVE 07/24/2016 1136   PROTEINUR 100 (A) 07/24/2016 1136   UROBILINOGEN 0.2 07/26/2014 1355   NITRITE NEGATIVE 07/24/2016 1136   LEUKOCYTESUR NEGATIVE 07/24/2016 1136     RADIOLOGY: Dg Chest Port 1 View  Result Date: 07/24/2016 CLINICAL DATA:  Shortness of breath, hypoxia EXAM: PORTABLE CHEST 1 VIEW COMPARISON:  01/25/2015 FINDINGS: Cardiomegaly again noted. Central mild vascular congestion without convincing pulmonary edema. Probable small left pleural effusion left basilar atelectasis. No segmental infiltrate. IMPRESSION: Central mild vascular congestion without convincing pulmonary edema. Probable small left pleural effusion left basilar atelectasis. No segmental infiltrate. Electronically Signed   By: Natasha Mead M.D.   On: 07/24/2016 12:15    EKG: Orders placed or performed during the hospital encounter of 01/25/15  . EKG 12-Lead  . EKG 12-Lead  . EKG    IMPRESSION AND PLAN: Patient's 83 year old presenting with generalized weakness and deconditioning  1. Shortness of breath suspect  related to the flu We will treat her with oral Levaquin We'll do when necessary nebulizers Will obtain echocardiogram of the heart  2. Elevated  troponin could be due to demand ischemia We will cycle troponin levels Echocardiogram of the heart Aspirin therapy Cardiology consult  3. Influenza Continued therapy with Tamiflu  4. Diabetes type 2 continue therapy with metformin Place on sliding scale  5. GERD continue Nexium  6. CODE STATUS full  7. Miscellaneous Lovenox for DVT prophylaxis   All the records are reviewed and case discussed with ED provider. Management plans discussed with the patient, family and they are in agreement.  CODE STATUS: Code Status History    Date Active Date Inactive Code Status Order ID Comments User Context   01/25/2015  3:03 PM 01/30/2015  2:41 PM Full Code [id number]  Ramonita LabAruna Gouru, MD Inpatient       TOTAL TIME TAKING CARE OF THIS PATIENT:55 minutes.    Auburn BilberryPATEL, Mykayla Brinton M.D on 07/24/2016 at 2:35 PM  Between 7am to 6pm - Pager - [phone]  After 6pm go to [email or web address] - password [email or web address]  [facility]  Office  [phone]  CC: Primary care physician; No PCP Per Patient

## 2016-07-24 NOTE — ED Notes (Signed)
Interpreter in use at this time. [id number].

## 2016-07-24 NOTE — ED Provider Notes (Signed)
[facility] Emergency Department Provider Note ____________________________________________   I have reviewed the triage vital signs and the triage nursing note.  HISTORY  Chief Complaint Code Sepsis   Historian Patient's daughter translating Patient speaks portuguese and short of breath  HPI Euretha Najarro is a 83 y.o. female spends several weeks per year here with her daughter, and daughter states that she started getting sick on Monday, about 5 days ago now. She's had decreased by mouth intake. Moderate nausea, moderate cough without productive of sputum. Positive for subjective fevers and body aches. No headache or confusion or altered mental status. She's had some chest pressure in the center of her chest.  Mild diarrhea.  She occasionally has to use an inhaler, but daughter states she's not diagnosed with any lung problems. History of a stroke and takes Plavix.    Past Medical History:  Diagnosis Date  . Arthritis   . Stroke [facility])     Patient Active Problem List   Diagnosis Date Noted  . Cerebral thrombosis with cerebral infarction (HCC) 01/28/2015  . Hyponatremia 01/25/2015    Past Surgical History:  Procedure Laterality Date  . ABDOMINAL HYSTERECTOMY    . CHOLECYSTECTOMY      Prior to Admission medications   Medication Sig Start Date End Date Taking? Authorizing Provider  acetaminophen (TYLENOL) 325 MG tablet Take 650 mg by mouth every 6 (six) hours as needed.   Yes Historical Provider, MD  atorvastatin (LIPITOR) 40 MG tablet Take 1 tablet (40 mg total) by mouth daily. 01/29/15  Yes Katha Hamming, MD  esomeprazole (NEXIUM) 40 MG capsule Take 40 mg by mouth daily at 12 noon.   Yes Historical Provider, MD  hydrALAZINE (APRESOLINE) 25 MG tablet Take 1 tablet (25 mg total) by mouth 3 (three) times daily. 01/30/15  Yes Katha Hamming, MD  metFORMIN (GLUCOPHAGE-XR) 500 MG 24 hr tablet Take 500 mg by mouth daily. 01/02/16  Yes Historical  Provider, MD  PROAIR HFA 108 (90 BASE) MCG/ACT inhaler Inhale 2 puffs into the lungs 4 (four) times daily as needed.   Yes Historical Provider, MD  sertraline (ZOLOFT) 25 MG tablet Take 25 mg by mouth daily. 01/02/16  Yes Historical Provider, MD  vitamin B-12 (CYANOCOBALAMIN) 1000 MCG tablet Take 1,000 mcg by mouth daily.   Yes Historical Provider, MD  clopidogrel (PLAVIX) 75 MG tablet Take 1 tablet (75 mg total) by mouth daily. 07/26/14   Rolan Bucco, MD  ondansetron (ZOFRAN ODT) 4 MG disintegrating tablet Take 1 tablet (4 mg total) by mouth every 8 (eight) hours as needed for nausea or vomiting. 01/16/16   Sharman Cheek, MD    Allergies  Allergen Reactions  . Aspirin Other (See Comments)    Stomach irritation  . 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    No family history on file.  Social History Social History  Substance Use Topics  . Smoking status: Never Smoker  . Smokeless tobacco: Never Used  . Alcohol use Yes     Comment: occasional wine    Review of Systems  Constitutional: Positive for fever. Eyes: Negative for visual changes. ENT: Negative for sore throat. Cardiovascular: Positive for central chest pressure for about one week now. Respiratory: Positive for shortness of breath. Gastrointestinal: As per history of present illness. No bloody stool. Genitourinary: Negative for dysuria. Musculoskeletal: Negative for back pain. Skin: Negative for rash. Neurological: Negative for headache. 10  point Review of Systems otherwise negative ____________________________________________   PHYSICAL EXAM:  VITAL SIGNS: ED Triage Vitals  Enc Vitals Group     BP 07/24/16 1122 (!) 148/79     Pulse Rate 07/24/16 1112 (!) 115     Resp 07/24/16 1112 (!) 34     Temp 07/24/16 1112 100.1 F (37.8 C)     Temp src --      SpO2 07/24/16 1112 97 %     Weight --      Height --      Head  Circumference --      Peak Flow --      Pain Score 07/24/16 1122 0     Pain Loc --      Pain Edu? --      Excl. in GC? --      Constitutional: Alert and oriented. Dyspneic in mild respiratory distress. HEENT   Head: Normocephalic and atraumatic.      Eyes: Conjunctivae are normal. PERRL. Normal extraocular movements.      Ears:         Nose: No congestion/rhinnorhea.   Mouth/Throat: Mucous membranes are moderately dry.   Neck: No stridor. Cardiovascular/Chest tachycardic and regular  No murmurs, rubs, or gallops. Respiratory: Tachypnea without retractions. Positive for rhonchi throughout all fields, mild wheezing. Gastrointestinal: Soft. No distention, no guarding, no rebound. Nontender.   Genitourinary/rectal:Deferred Musculoskeletal: Nontender with normal range of motion in all extremities. No joint effusions.  No lower extremity tenderness.  No edema. Neurologic:  No facial droop.. No gross or focal neurologic deficits are appreciated. Skin:  Skin is warm, dry and intact. No rash noted. Psychiatric: Mood and affect are normal.    ____________________________________________  LABS (pertinent positives/negatives)  Labs Reviewed  COMPREHENSIVE METABOLIC PANEL - Abnormal; Notable for the following:       Result Value   Chloride 97 (*)    Glucose, Bld 227 (*)    Calcium 8.1 (*)    AST 50 (*)    All other components within normal limits  CBC WITH DIFFERENTIAL/PLATELET - Abnormal; Notable for the following:    Lymphs Abs 0.4 (*)    All other components within normal limits  URINALYSIS, COMPLETE (UACMP) WITH MICROSCOPIC - Abnormal; Notable for the following:    Color, Urine YELLOW (*)    APPearance CLEAR (*)    Glucose, UA 50 (*)    Hgb urine dipstick SMALL (*)    Protein, ur 100 (*)    Bacteria, UA RARE (*)    Squamous Epithelial / LPF 0-5 (*)    All other components within normal limits  TROPONIN I - Abnormal; Notable for the following:    Troponin I 0.43 (*)     All other components within normal limits  INFLUENZA PANEL BY PCR (TYPE A & B) - Abnormal; Notable for the following:    Influenza A By PCR POSITIVE (*)    All other components within normal limits  BRAIN NATRIURETIC PEPTIDE - Abnormal; Notable for the following:    B Natriuretic Peptide 124.0 (*)    All other components within normal limits  CULTURE, BLOOD (ROUTINE X 2)  CULTURE, BLOOD (ROUTINE X 2)  URINE CULTURE  LACTIC ACID, PLASMA  PROTIME-INR    ____________________________________________    EKG I, Governor Rooksebecca Mariely Mahr, MD, the attending physician have personally viewed and interpreted all ECGs.  109 bpm. Sinus tachycardia with possible PAC. Nonspecific intraventricular conduction delay. Left axis deviation. Nonspecific ST and T-wave ____________________________________________  RADIOLOGY All Xrays were viewed by me. Imaging interpreted by Radiologist.  Chest x-ray two-view:  IMPRESSION: Central mild vascular congestion without convincing pulmonary edema. Probable small left pleural effusion left basilar atelectasis. No segmental infiltrate. __________________________________________  PROCEDURES  Procedure(s) performed: None  Critical Care performed: CRITICAL CARE Performed by: Governor Rooks   Total critical care time: 30 minutes  Critical care time was exclusive of separately billable procedures and treating other patients.  Critical care was necessary to treat or prevent imminent or life-threatening deterioration.  Critical care was time spent personally by me on the following activities: development of treatment plan with patient and/or surrogate as well as nursing, discussions with consultants, evaluation of patient's response to treatment, examination of patient, obtaining history from patient or surrogate, ordering and performing treatments and interventions, ordering and review of laboratory studies, ordering and review of radiographic studies, pulse oximetry  and re-evaluation of patient's condition.   ____________________________________________   ED COURSE / ASSESSMENT AND PLAN  Pertinent labs & imaging results that were available during my care of the patient were reviewed by me and considered in my medical decision making (see chart for details).  I attempted to use the [place] translator video conference, but speaker system was not working. The daughter seemed to be doing an excellent job interpreting. She's been with her mother and was able to tell me exactly what's been going on. Soundly she's been having flulike illness, and most prominent symptom now is generalized weakness, body aches, and cough/congestion.  I will place her on the sepsis pathway given hypoxia, fever, and tachycardia.  Appears to be more acute infectious respiratory symptoms than acute CHF, starting on initial fluid bolus.  Has tried inhaler before, will give duoneb given clinical bronchospasm.  O2 sat 86% on room air, 98 4% on 2 L nasal cannula.  Patient did require increase to 5-6 L nasal cannula, 92-93%. I'm going to give her 2 more DuoNeb.  Chest x-ray without focal infiltrate. White blood count normal. No hypotension. She did test positive for influenza. Patient was started on Tamiflu. Discussed with Dr. Allena Katz for hospital admission.    CONSULTATIONS:   Hospitalist for admission.   Patient / Family / Caregiver informed of clinical course, medical decision-making process, and agree with plan.  ___________________________________________   FINAL CLINICAL IMPRESSION(S) / ED DIAGNOSES   Final diagnoses:  Hypoxia  SIRS (systemic inflammatory response syndrome) (HCC)  Bronchospasm, acute  Troponin I above reference range  Influenza A              Note: This dictation was prepared with Dragon dictation. Any transcriptional errors that result from this process are unintentional    Governor Rooks, MD 07/24/16 1424

## 2016-07-24 NOTE — ED Notes (Signed)
Patient placed in hospital bed for comfort.

## 2016-07-24 NOTE — ED Notes (Addendum)
In and out cath completed by this RN. Patient tolerated well. Sterile tehnique mcaintained. Clear, yellow urine returned.

## 2016-07-24 NOTE — ED Triage Notes (Signed)
Pt from [facility], oxygen sat 84% on RA. Pt on 2L upon arrival, sat 88%. Pt on 6L in triage, sat 94%. Pt with vomiting, diarrhea, cough, fever

## 2016-07-25 ENCOUNTER — Inpatient Hospital Stay: Payer: Medicare Other

## 2016-07-25 ENCOUNTER — Encounter (HOSPITAL_COMMUNITY): Payer: Self-pay | Admitting: *Deleted

## 2016-07-25 ENCOUNTER — Inpatient Hospital Stay (HOSPITAL_COMMUNITY)
Admit: 2016-07-25 | Discharge: 2016-07-25 | Disposition: A | Discharge status: Home / Self Care | Payer: Medicare Other | Attending: Internal Medicine | Admitting: Internal Medicine

## 2016-07-25 DIAGNOSIS — R748 Abnormal levels of other serum enzymes: Secondary | ICD-10-CM

## 2016-07-25 DIAGNOSIS — R0600 Dyspnea, unspecified: Secondary | ICD-10-CM

## 2016-07-25 LAB — BASIC METABOLIC PANEL
Anion gap: 4 — ABNORMAL LOW (ref 5–15)
BUN: 12 mg/dL (ref 6–20)
CO2: 34 mmol/L — ABNORMAL HIGH (ref 22–32)
CREATININE: 0.58 mg/dL (ref 0.44–1.00)
Calcium: 8 mg/dL — ABNORMAL LOW (ref 8.9–10.3)
Chloride: 98 mmol/L — ABNORMAL LOW (ref 101–111)
GFR calc Af Amer: >60 mL/min (ref >60)
GLUCOSE: 127 mg/dL — AB (ref 65–99)
Potassium: 3.9 mmol/L (ref 3.5–5.1)
SODIUM: 136 mmol/L (ref 135–145)

## 2016-07-25 LAB — EXPECTORATED SPUTUM ASSESSMENT W REFEX TO RESP CULTURE

## 2016-07-25 LAB — GLUCOSE, CAPILLARY
Glucose-Capillary: 126 mg/dL — ABNORMAL HIGH (ref 65–99)
Glucose-Capillary: 152 mg/dL — ABNORMAL HIGH (ref 65–99)
Glucose-Capillary: 176 mg/dL — ABNORMAL HIGH (ref 65–99)
Glucose-Capillary: 213 mg/dL — ABNORMAL HIGH (ref 65–99)

## 2016-07-25 LAB — CBC
HCT: 35.6 % (ref 35.0–47.0)
Hemoglobin: 12.2 g/dL (ref 12.0–16.0)
MCH: 29.4 pg (ref 26.0–34.0)
MCHC: 34.3 g/dL (ref 32.0–36.0)
MCV: 85.9 fL (ref 80.0–100.0)
Platelets: 136 10*3/uL — ABNORMAL LOW (ref 150–440)
RBC: 4.15 MIL/uL (ref 3.80–5.20)
RDW: 14.3 % (ref 11.5–14.5)
WBC: 10.1 10*3/uL (ref 3.6–11.0)

## 2016-07-25 LAB — TROPONIN I
TROPONIN I: 0.66 ng/mL — AB (ref <0.03)
Troponin I: 0.46 ng/mL (ref <0.03)

## 2016-07-25 LAB — ECHOCARDIOGRAM COMPLETE
Height: 62 in
Weight: 2779.2 oz

## 2016-07-25 LAB — EXPECTORATED SPUTUM ASSESSMENT W GRAM STAIN, RFLX TO RESP C

## 2016-07-25 MED ORDER — SODIUM CHLORIDE 0.9 % IV SOLN
Freq: Once | INTRAVENOUS | Status: AC
  Administered 2016-07-25: 12:00:00 via INTRAVENOUS

## 2016-07-25 MED ORDER — ALBUTEROL SULFATE (2.5 MG/3ML) 0.083% IN NEBU
2.5000 mg | INHALATION_SOLUTION | RESPIRATORY_TRACT | Status: DC
  Administered 2016-07-25 – 2016-07-30 (×30): 2.5 mg via RESPIRATORY_TRACT
  Filled 2016-07-25 (×31): qty 3

## 2016-07-25 MED ORDER — PHENOL 1.4 % MT LIQD
1.0000 | OROMUCOSAL | Status: DC | PRN
  Administered 2016-07-25 – 2016-07-26 (×2): 1 via OROMUCOSAL
  Filled 2016-07-25: qty 177

## 2016-07-25 MED ORDER — METOPROLOL TARTRATE 25 MG PO TABS
12.5000 mg | ORAL_TABLET | Freq: Two times a day (BID) | ORAL | Status: DC
  Administered 2016-07-25 – 2016-07-30 (×11): 12.5 mg via ORAL
  Filled 2016-07-25 (×11): qty 1

## 2016-07-25 MED ORDER — METHYLPREDNISOLONE SODIUM SUCC 125 MG IJ SOLR
60.0000 mg | INTRAMUSCULAR | Status: DC
  Administered 2016-07-25 – 2016-07-26 (×7): 60 mg via INTRAVENOUS
  Filled 2016-07-25 (×7): qty 2

## 2016-07-25 NOTE — Progress Notes (Signed)
[facility] [place] [facility]   PATIENT NAME: Jo MangesMaria Moreno    MR#:  [id number]  DATE OF BIRTH:  [birth year]  SUBJECTIVE: Admitted for acute respiratory failure, sepsis secondary to type A influenza, pneumonia. Patient is seen today, she has shortness of breath, requiring 5 L of oxygen, has wheezing and she says she feels terrible. Daughter  At  bedside interpreted  [place] .  CHIEF COMPLAINT:   Chief Complaint  Patient presents with  . Code Sepsis    REVIEW OF SYSTEMS:   Review of Systems  Constitutional: Positive for fever. Negative for chills.  HENT: Positive for congestion and sore throat. Negative for hearing loss.   Eyes: Negative for blurred vision, double vision and photophobia.  Respiratory: Positive for cough, shortness of breath and wheezing. Negative for hemoptysis.   Cardiovascular: Negative for palpitations, orthopnea and leg swelling.  Gastrointestinal: Negative for abdominal pain, diarrhea and vomiting.  Genitourinary: Negative for dysuria and urgency.  Musculoskeletal: Positive for myalgias. Negative for neck pain.  Skin: Negative for rash.  Neurological: Negative for dizziness, focal weakness, seizures, weakness and headaches.  Psychiatric/Behavioral: Negative for memory loss. The patient does not have insomnia.      DRUG ALLERGIES:   Allergies  Allergen Reactions  . Aspirin Other (See Comments)    Stomach irritation  . 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    VITALS:  Blood pressure (!) 128/50, pulse 81, temperature 97.9 F (36.6 C), temperature source Oral, resp. rate (!) 22, height 5\' 2"  (1.575 m), weight 78.8 kg (173 lb 11.2 oz), SpO2 98 %.  PHYSICAL EXAMINATION:  GENERAL:  83 y.o.-year-old patient lying in the bed with no acute distress.  EYES: Pupils equal, round, reactive to light and accommodation. No scleral icterus. Extraocular  muscles intact.  HEENT: Head atraumatic, normocephalic. Oropharynx and nasopharynx clear. Clinically dry mucosa. NECK:  Supple, no jugular venous distention. No thyroid enlargement, no tenderness.  LUNGS: bilateral expiratory wheezing present in all lung fields  CARDIOVASCULAR: S1, S2 normal. No murmurs, rubs, or gallops.  ABDOMEN: Soft, nontender, nondistended. Bowel sounds present. No organomegaly or mass.  EXTREMITIES: No pedal edema, cyanosis, or clubbing.  NEUROLOGIC: Cranial nerves II through XII are intact. Muscle strength 5/5 in all extremities. Sensation intact. Gait not checked.  PSYCHIATRIC: The patient is alert and oriented x 3.  SKIN: No obvious rash, lesion, or ulcer.    LABORATORY PANEL:   CBC  Recent Labs Lab 07/25/16 0820  WBC 10.1  HGB 12.2  HCT 35.6  PLT 136*   ------------------------------------------------------------------------------------------------------------------  Chemistries   Recent Labs Lab 07/24/16 1115 07/25/16 0820  NA 135 136  K 3.8 3.9  CL 97* 98*  CO2 30 34*  GLUCOSE 227* 127*  BUN 12 12  CREATININE 0.69 0.58  CALCIUM 8.1* 8.0*  AST 50*  --   ALT 30  --   ALKPHOS 54  --   BILITOT 0.3  --    ------------------------------------------------------------------------------------------------------------------  Cardiac Enzymes  Recent Labs Lab 07/25/16 0820  TROPONINI 0.46*   ------------------------------------------------------------------------------------------------------------------  RADIOLOGY:  Dg Chest Port 1 View  Result Date: 07/24/2016 CLINICAL DATA:  Shortness of breath, hypoxia EXAM: PORTABLE CHEST 1 VIEW COMPARISON:  01/25/2015 FINDINGS: Cardiomegaly again noted. Central mild vascular congestion without convincing pulmonary edema. Probable small left pleural effusion left basilar atelectasis. No segmental infiltrate. IMPRESSION: Central mild vascular congestion without convincing pulmonary edema. Probable  small  left pleural effusion left basilar atelectasis. No segmental infiltrate. Electronically Signed   By: Natasha Mead M.D.   On: 07/24/2016 12:15    EKG:   Orders placed or performed during the hospital encounter of 01/25/15  . EKG 12-Lead  . EKG 12-Lead  . EKG    ASSESSMENT AND PLAN:  1.clinical sepsis present on admission secondary to pneumonia, influenza: #2 acute respiratory failure with hypoxia secondary to pneumonia, influenza: Continue oxygen, continue Tamiflu, continue Levaquin, added Solu-Medrol because of wheezing, continue nebulizers, discussed with patient's daughter,  monitor respiratory status closely, explained that she is very sick.   #2 diabetes mellitus type 2: Continue SSI. Coverage, metformin.  #3 elevated troponins without chest pain patient has lots of cough, elevated troponins likely secondary to respiratory failure with pneumonia, influenza. Check echocardiogram, cardiology consulted, continue aspirin, statins, Plavix, add small dose beta blockers.                                                     4. History of previous stroke without any new focal neurological deficit. Continue aspirin, statins, Plavix,  All the records are reviewed and case discussed with Care Management/Social Workerr. Management plans discussed with the patient, family and they are in agreement.  CODE STATUS: Full  TOTAL TIME TAKING CARE OF THIS PATIENT: 35 minutes.   POSSIBLE D/C IN 1-2DAYS, DEPENDING ON CLINICAL CONDITION.   Katha Hamming M.D on 07/25/2016 at 11:50 AM  Between 7am to 6pm - Pager - [phone]  After 6pm go to [email or web address] - password [email or web address] [facility]  [facility] [place] [facility]  Office  [phone]  CC: Primary care physician; No PCP Per Patient   Note: This dictation was prepared with Dragon dictation along with smaller phrase technology. Any transcriptional errors that result from this process are unintentional.

## 2016-07-25 NOTE — Consult Note (Signed)
Primary Physician: Primary Cardiologist:New    Asked to see re elevated troponin    HPI: Pt is an 83 yo with hsitory of CVA, type II DM admitted yesterday with SOB and achiness.  Also fevers, N/V and diarrhea   CP with coughing  Trop checked and found to be elevated   Peak and 0.89  She deneis a history of heart problems  No CP at other times  Was hypoxic on admit  The pt continues to feel bad.  Throat sore  Weak  Hurts in chest with deep breath  Coughing some       Past Medical History:  Diagnosis Date  . Arthritis   . DMII (diabetes mellitus, type 2) (HCC)   . Stroke [facility])     Medications Prior to Admission  Medication Sig Dispense Refill  . acetaminophen (TYLENOL) 325 MG tablet Take 650 mg by mouth every 6 (six) hours as needed.    Marland Kitchen atorvastatin (LIPITOR) 40 MG tablet Take 1 tablet (40 mg total) by mouth daily. 30 tablet 0  . esomeprazole (NEXIUM) 40 MG capsule Take 40 mg by mouth daily at 12 noon.    . hydrALAZINE (APRESOLINE) 25 MG tablet Take 1 tablet (25 mg total) by mouth 3 (three) times daily. 60 tablet 0  . metFORMIN (GLUCOPHAGE-XR) 500 MG 24 hr tablet Take 500 mg by mouth daily.  0  . PROAIR HFA 108 (90 BASE) MCG/ACT inhaler Inhale 2 puffs into the lungs 4 (four) times daily as needed.  0  . sertraline (ZOLOFT) 25 MG tablet Take 25 mg by mouth daily.  0  . vitamin B-12 (CYANOCOBALAMIN) 1000 MCG tablet Take 1,000 mcg by mouth daily.    . clopidogrel (PLAVIX) 75 MG tablet Take 1 tablet (75 mg total) by mouth daily. 30 tablet 0  . ondansetron (ZOFRAN ODT) 4 MG disintegrating tablet Take 1 tablet (4 mg total) by mouth every 8 (eight) hours as needed for nausea or vomiting. 20 tablet 0     . albuterol  2.5 mg Inhalation Q4H  . aspirin EC  325 mg Oral Daily  . atorvastatin  40 mg Oral Daily  . clopidogrel  75 mg Oral Daily  . enoxaparin (LOVENOX) injection  40 mg Subcutaneous Q24H  . guaiFENesin  600 mg Oral BID  . hydrALAZINE  25 mg Oral TID  . insulin aspart   0-9 Units Subcutaneous TID WC  . levofloxacin  250 mg Oral Daily  . metFORMIN  500 mg Oral Q breakfast  . oseltamivir  30 mg Oral BID  . pantoprazole  40 mg Oral Daily  . sertraline  25 mg Oral Daily  . sodium chloride flush  3 mL Intravenous Q12H  . sodium chloride flush  3 mL Intravenous Q12H  . vitamin B-12  1,000 mcg Oral Daily    Infusions:   Allergies  Allergen Reactions  . Aspirin Other (See Comments)    Stomach irritation  . 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    Social History   Social History  . Marital status: Widowed    Spouse name: N/A  . Number of children: N/A  . Years of education: N/A   Occupational History  . Not on file.   Social History Main Topics  . Smoking status: Never Smoker  . Smokeless tobacco: Never Used  . Alcohol use Yes     Comment: occasional  wine  . Drug use: No  . Sexual activity: Not on file   Other Topics Concern  . Not on file   Social History Narrative  . No narrative on file    History reviewed. No pertinent family history.  REVIEW OF SYSTEMS:  All systems reviewed  Negative to the above problem except as noted above.    PHYSICAL EXAM: Vitals:   07/25/16 0457 07/25/16 0926  BP: (!) 127/58 (!) 125/53  Pulse: 73 85  Resp: 18 20  Temp: 98.2 F (36.8 C) 98.1 F (36.7 C)     Intake/Output Summary (Last 24 hours) at 07/25/16 1045 Last data filed at 07/25/16 0919  Gross per 24 hour  Intake             1000 ml  Output              400 ml  Net              600 ml    General:  Frail 83 yo who appears uncomfortable but in NAD   HEENT: normal Neck: supple. no JVD. Carotids 2+ bilat; no bruits. No lymphadenopathy or thryomegaly appreciated. Cor: PMI nondisplaced. Regular rate & rhythm. No rubs, gallops or murmurs. Lungs: Mild wheezes bilaterally  Moving air   Abdomen: soft, nontender, nondistended. No hepatosplenomegaly. No bruits  or masses. Good bowel sounds. Extremities: no cyanosis, clubbing, rash, edema Neuro: alert & oriented x 3, cranial nerves grossly intact. moves all 4 extremities w/o difficulty. Affect pleasant.  ECG:  Not ordered    Tele;  SR    Results for orders placed or performed during the hospital encounter of 07/24/16 (from the past 24 hour(s))  Comprehensive metabolic panel     Status: Abnormal   Collection Time: 07/24/16 11:15 AM  Result Value Ref Range   Sodium 135 135 - 145 mmol/L   Potassium 3.8 3.5 - 5.1 mmol/L   Chloride 97 (L) 101 - 111 mmol/L   CO2 30 22 - 32 mmol/L   Glucose, Bld 227 (H) 65 - 99 mg/dL   BUN 12 6 - 20 mg/dL   Creatinine, Ser 4.540.69 0.44 - 1.00 mg/dL   Calcium 8.1 (L) 8.9 - 10.3 mg/dL   Total Protein 7.5 6.5 - 8.1 g/dL   Albumin 3.6 3.5 - 5.0 g/dL   AST 50 (H) 15 - 41 U/L   ALT 30 14 - 54 U/L   Alkaline Phosphatase 54 38 - 126 U/L   Total Bilirubin 0.3 0.3 - 1.2 mg/dL   GFR calc non Af Amer >60 >60 mL/min   GFR calc Af Amer >60 >60 mL/min   Anion gap 8 5 - 15  Lactic acid, plasma     Status: None   Collection Time: 07/24/16 11:15 AM  Result Value Ref Range   Lactic Acid, Venous 1.6 0.5 - 1.9 mmol/L  CBC with Differential     Status: Abnormal   Collection Time: 07/24/16 11:15 AM  Result Value Ref Range   WBC 7.0 3.6 - 11.0 K/uL   RBC 4.50 3.80 - 5.20 MIL/uL   Hemoglobin 13.1 12.0 - 16.0 g/dL   HCT 09.838.3 11.935.0 - 14.747.0 %   MCV 85.1 80.0 - 100.0 fL   MCH 29.1 26.0 - 34.0 pg   MCHC 34.2 32.0 - 36.0 g/dL   RDW 82.913.9 56.211.5 - 13.014.5 %   Platelets 178 150 - 440 K/uL   Neutrophils Relative % 88 %   Neutro Abs 6.2  1.4 - 6.5 K/uL   Lymphocytes Relative 6 %   Lymphs Abs 0.4 (L) 1.0 - 3.6 K/uL   Monocytes Relative 6 %   Monocytes Absolute 0.4 0.2 - 0.9 K/uL   Eosinophils Relative 0 %   Eosinophils Absolute 0.0 0 - 0.7 K/uL   Basophils Relative 0 %   Basophils Absolute 0.0 0 - 0.1 K/uL  Protime-INR     Status: None   Collection Time: 07/24/16 11:15 AM  Result Value  Ref Range   Prothrombin Time 15.0 11.4 - 15.2 seconds   INR 1.17   Culture, blood (Routine x 2)     Status: None (Preliminary result)   Collection Time: 07/24/16 11:15 AM  Result Value Ref Range   Specimen Description BLOOD LEFT ASSIST CONTROL    Special Requests      BOTTLES DRAWN AEROBIC AND ANAEROBIC 10MLAERO,12MLANA   Culture NO GROWTH < 24 HOURS    Report Status PENDING   Troponin I     Status: Abnormal   Collection Time: 07/24/16 11:15 AM  Result Value Ref Range   Troponin I 0.43 (HH) <0.03 ng/mL  Brain natriuretic peptide     Status: Abnormal   Collection Time: 07/24/16 11:15 AM  Result Value Ref Range   B Natriuretic Peptide 124.0 (H) 0.0 - 100.0 pg/mL  Culture, blood (Routine x 2)     Status: None (Preliminary result)   Collection Time: 07/24/16 11:19 AM  Result Value Ref Range   Specimen Description BLOOD RIGHT ASSIST CONTROL    Special Requests      BOTTLES DRAWN AEROBIC AND ANAEROBIC 9MLAERO,10MLANA   Culture NO GROWTH < 24 HOURS    Report Status PENDING   Influenza panel by PCR (type A & B)     Status: Abnormal   Collection Time: 07/24/16 11:35 AM  Result Value Ref Range   Influenza A By PCR POSITIVE (A) NEGATIVE   Influenza B By PCR NEGATIVE NEGATIVE  Urinalysis, Complete w Microscopic     Status: Abnormal   Collection Time: 07/24/16 11:36 AM  Result Value Ref Range   Color, Urine YELLOW (A) YELLOW   APPearance CLEAR (A) CLEAR   Specific Gravity, Urine 1.020 1.005 - 1.030   pH 5.0 5.0 - 8.0   Glucose, UA 50 (A) NEGATIVE mg/dL   Hgb urine dipstick SMALL (A) NEGATIVE   Bilirubin Urine NEGATIVE NEGATIVE   Ketones, ur NEGATIVE NEGATIVE mg/dL   Protein, ur 696 (A) NEGATIVE mg/dL   Nitrite NEGATIVE NEGATIVE   Leukocytes, UA NEGATIVE NEGATIVE   RBC / HPF 0-5 0 - 5 RBC/hpf   WBC, UA 0-5 0 - 5 WBC/hpf   Bacteria, UA RARE (A) NONE SEEN   Squamous Epithelial / LPF 0-5 (A) NONE SEEN   Mucous PRESENT   Glucose, capillary     Status: Abnormal   Collection Time:  07/24/16  5:31 PM  Result Value Ref Range   Glucose-Capillary 155 (H) 65 - 99 mg/dL  Troponin I     Status: Abnormal   Collection Time: 07/24/16  8:35 PM  Result Value Ref Range   Troponin I 0.89 (HH) <0.03 ng/mL  TSH     Status: None   Collection Time: 07/24/16  8:35 PM  Result Value Ref Range   TSH 1.134 0.350 - 4.500 uIU/mL  Glucose, capillary     Status: Abnormal   Collection Time: 07/24/16  8:49 PM  Result Value Ref Range   Glucose-Capillary 167 (H) 65 - 99 mg/dL  Comment 1 Notify RN    Comment 2 Document in Chart   Troponin I     Status: Abnormal   Collection Time: 07/25/16  2:36 AM  Result Value Ref Range   Troponin I 0.66 (HH) <0.03 ng/mL  Culture, sputum-assessment     Status: None   Collection Time: 07/25/16  5:45 AM  Result Value Ref Range   Specimen Description EXPECTORATED SPUTUM    Special Requests NONE    Sputum evaluation THIS SPECIMEN IS ACCEPTABLE FOR SPUTUM CULTURE    Report Status 07/25/2016 FINAL   Glucose, capillary     Status: Abnormal   Collection Time: 07/25/16  7:27 AM  Result Value Ref Range   Glucose-Capillary 126 (H) 65 - 99 mg/dL  Troponin I     Status: Abnormal   Collection Time: 07/25/16  8:20 AM  Result Value Ref Range   Troponin I 0.46 (HH) <0.03 ng/mL  CBC     Status: Abnormal   Collection Time: 07/25/16  8:20 AM  Result Value Ref Range   WBC 10.1 3.6 - 11.0 K/uL   RBC 4.15 3.80 - 5.20 MIL/uL   Hemoglobin 12.2 12.0 - 16.0 g/dL   HCT 16.1 09.6 - 04.5 %   MCV 85.9 80.0 - 100.0 fL   MCH 29.4 26.0 - 34.0 pg   MCHC 34.3 32.0 - 36.0 g/dL   RDW 40.9 81.1 - 91.4 %   Platelets 136 (L) 150 - 440 K/uL  Basic metabolic panel     Status: Abnormal   Collection Time: 07/25/16  8:20 AM  Result Value Ref Range   Sodium 136 135 - 145 mmol/L   Potassium 3.9 3.5 - 5.1 mmol/L   Chloride 98 (L) 101 - 111 mmol/L   CO2 34 (H) 22 - 32 mmol/L   Glucose, Bld 127 (H) 65 - 99 mg/dL   BUN 12 6 - 20 mg/dL   Creatinine, Ser 7.82 0.44 - 1.00 mg/dL    Calcium 8.0 (L) 8.9 - 10.3 mg/dL   GFR calc non Af Amer >60 >60 mL/min   GFR calc Af Amer >60 >60 mL/min   Anion gap 4 (L) 5 - 15   Dg Chest Port 1 View  Result Date: 07/24/2016 CLINICAL DATA:  Shortness of breath, hypoxia EXAM: PORTABLE CHEST 1 VIEW COMPARISON:  01/25/2015 FINDINGS: Cardiomegaly again noted. Central mild vascular congestion without convincing pulmonary edema. Probable small left pleural effusion left basilar atelectasis. No segmental infiltrate. IMPRESSION: Central mild vascular congestion without convincing pulmonary edema. Probable small left pleural effusion left basilar atelectasis. No segmental infiltrate. Electronically Signed   By: Natasha Mead M.D.   On: 07/24/2016 12:15     ASSESSMENT  Asked to see re elevated troponin  Pt an 83 yo admitted with f/ N/V diarrhea achiness  Positive for influenza A.  Minimal bump in troponin  EKG not done  Tele with SR   ON exam mild wheezing  ? Some mild edema    I am not convinced of active coronary ischemia  Most like Type II demand ischemia in setting of sepsis.   I would continue to treat supportively  For sepsis   Watch I/O closely  May need some IV diuresis   Get EKG   WIll continue to follow with you.

## 2016-07-26 LAB — GLUCOSE, CAPILLARY
GLUCOSE-CAPILLARY: 345 mg/dL — AB (ref 65–99)
GLUCOSE-CAPILLARY: 203 mg/dL — AB (ref 65–99)
Glucose-Capillary: 198 mg/dL — ABNORMAL HIGH (ref 65–99)
Glucose-Capillary: 198 mg/dL — ABNORMAL HIGH (ref 65–99)

## 2016-07-26 LAB — URINE CULTURE: Culture: NO GROWTH

## 2016-07-26 MED ORDER — DOCUSATE SODIUM 100 MG PO CAPS
100.0000 mg | ORAL_CAPSULE | Freq: Two times a day (BID) | ORAL | Status: DC
  Administered 2016-07-26 – 2016-07-30 (×9): 100 mg via ORAL
  Filled 2016-07-26 (×9): qty 1

## 2016-07-26 MED ORDER — INSULIN GLARGINE 100 UNIT/ML ~~LOC~~ SOLN
5.0000 [IU] | Freq: Every day | SUBCUTANEOUS | Status: DC
  Administered 2016-07-26: 5 [IU] via SUBCUTANEOUS
  Filled 2016-07-26 (×3): qty 0.05

## 2016-07-26 MED ORDER — FUROSEMIDE 10 MG/ML IJ SOLN
40.0000 mg | Freq: Once | INTRAMUSCULAR | Status: AC
  Administered 2016-07-26: 40 mg via INTRAVENOUS
  Filled 2016-07-26: qty 4

## 2016-07-26 MED ORDER — METHYLPREDNISOLONE SODIUM SUCC 40 MG IJ SOLR
40.0000 mg | Freq: Four times a day (QID) | INTRAMUSCULAR | Status: DC
  Administered 2016-07-26 – 2016-07-27 (×3): 40 mg via INTRAVENOUS
  Filled 2016-07-26 (×3): qty 1

## 2016-07-26 NOTE — Progress Notes (Signed)
Initial Nutrition Assessment  DOCUMENTATION CODES:   Not applicable  INTERVENTION:  Magic cup TID with meals, each supplement provides 290 kcal and 9 grams of protein  snacks  NUTRITION DIAGNOSIS:   Inadequate oral intake related to acute illness as evidenced by per patient/family report.  GOAL:   Patient will meet greater than or equal to 90% of their needs  MONITOR:   PO intake, Supplement acceptance  REASON FOR ASSESSMENT:   Malnutrition Screening Tool    ASSESSMENT:   83 yo with hsitory of CVA, type II DM admitted yesterday with SOB and achiness. Admitted for flu, PNA w/ sepsis  Met with pt and daughter in room today. Pt sleeping; RD was able to obtain most information from daughter. Per daughter, pt has not been eating a lot for a couple of years. She states that pt will mainly just eat a few bites at each meal. Pt stopped eating a few days ago when she got the flu and started having a lot of congestion. Per chart, pt is weight stable. Pt is currently eating 50% meals. Pt likes ice cream so RD will order Magic Cups.   Medications reviewed and include: aspirin, plavix, lovenox, insulin, metformin, solu-medrol, protonix, Vit B-12   Labs reviewed: Cl 98(L), C02 34(H)  Nutrition-Focused physical exam completed. Findings are no fat depletion, mild muscle depletion in clavicular region, and no edema.   Diet Order:  Diet heart healthy/carb modified Room service appropriate? Yes; Fluid consistency: Thin  Skin:  Reviewed, no issues  Last BM:  1/19  Height:   Ht Readings from Last 1 Encounters:  07/24/16 5' 2"  (1.575 m)    Weight:   Wt Readings from Last 1 Encounters:  07/24/16 173 lb 11.2 oz (78.8 kg)    Ideal Body Weight:  50 kg  BMI:  Body mass index is 31.77 kg/m.  Estimated Nutritional Needs:   Kcal:  1400-1700kcal/day   Protein:  86-102g/day   Fluid:  >1.4L/day   EDUCATION NEEDS:   No education needs identified at this time  Koleen Distance,  RD, LDN Pager #[phone]

## 2016-07-26 NOTE — Progress Notes (Signed)
[facility] [place] at [facility]   PATIENT NAME: Jo Moreno    MR#:  [id number]  DATE OF BIRTH:  1924/08/16  SUBJECTIVE: Admitted for acute respiratory failure, sepsis secondary to type A influenza, pneumonia.  Daughter  At  bedside interpreted  [place] . She says she feels little better, less wheezing, no fever.   CHIEF COMPLAINT:   Chief Complaint  Patient presents with  . Code Sepsis    REVIEW OF SYSTEMS:   Review of Systems  Constitutional: Negative for chills and fever.  HENT: Negative for congestion, hearing loss and sore throat.   Eyes: Negative for blurred vision, double vision and photophobia.  Respiratory: Positive for cough, shortness of breath and wheezing. Negative for hemoptysis.   Cardiovascular: Negative for palpitations, orthopnea and leg swelling.  Gastrointestinal: Negative for abdominal pain, diarrhea and vomiting.  Genitourinary: Negative for dysuria and urgency.  Musculoskeletal: Negative for myalgias and neck pain.  Skin: Negative for rash.  Neurological: Negative for dizziness, focal weakness, seizures, weakness and headaches.  Psychiatric/Behavioral: Negative for memory loss. The patient does not have insomnia.      DRUG ALLERGIES:   Allergies  Allergen Reactions  . Aspirin Other (See Comments)    Stomach irritation  . 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    VITALS:  Blood pressure 132/76, pulse 74, temperature 98.2 F (36.8 C), temperature source Oral, resp. rate 18, height 5\' 2"  (1.575 m), weight 78.8 kg (173 lb 11.2 oz), SpO2 92 %.  PHYSICAL EXAMINATION:  GENERAL:  82 y.o.-year-old patient lying in the bed with no acute distress.  EYES: Pupils equal, round, reactive to light and accommodation. No scleral icterus. Extraocular muscles intact.  HEENT: Head atraumatic, normocephalic. Oropharynx and nasopharynx clear. Clinically dry  mucosa. NECK:  Supple, no jugular venous distention. No thyroid enlargement, no tenderness.  LUNGS: bilateral expiratory wheezing present in all lung fields but better than yesterday. CARDIOVASCULAR: S1, S2 normal. No murmurs, rubs, or gallops.  ABDOMEN: Soft, nontender, nondistended. Bowel sounds present. No organomegaly or mass.  EXTREMITIES: No pedal edema, cyanosis, or clubbing.  NEUROLOGIC: Cranial nerves II through XII are intact. Muscle strength 5/5 in all extremities. Sensation intact. Gait not checked.  PSYCHIATRIC: The patient is alert and oriented x 3.  SKIN: No obvious rash, lesion, or ulcer.    LABORATORY PANEL:   CBC  Recent Labs Lab 07/25/16 0820  WBC 10.1  HGB 12.2  HCT 35.6  PLT 136*   ------------------------------------------------------------------------------------------------------------------  Chemistries   Recent Labs Lab 07/24/16 1115 07/25/16 0820  NA 135 136  K 3.8 3.9  CL 97* 98*  CO2 30 34*  GLUCOSE 227* 127*  BUN 12 12  CREATININE 0.69 0.58  CALCIUM 8.1* 8.0*  AST 50*  --   ALT 30  --   ALKPHOS 54  --   BILITOT 0.3  --    ------------------------------------------------------------------------------------------------------------------  Cardiac Enzymes  Recent Labs Lab 07/25/16 0820  TROPONINI 0.46*   ------------------------------------------------------------------------------------------------------------------  RADIOLOGY:  Dg Chest 2 View  Result Date: 07/25/2016 CLINICAL DATA:  Shortness of breath.  Diagnosed with flu yesterday. EXAM: CHEST  2 VIEW COMPARISON:  Radiographs yesterday. FINDINGS: Stable cardiomegaly and mediastinal contours. Small left pleural effusion and fluid in the fissures. Worsening vascular congestion with new peribronchial thickening. No confluent airspace disease. No pneumothorax. Overlying monitoring devices project over the thorax. IMPRESSION: Worsening vascular congestion. New peribronchial  thickening may be pulmonary edema or infectious/inflammatory. Cardiomegaly and left pleural effusion are unchanged. Electronically Signed   By: Rubye OaksMelanie  Ehinger M.D.   On: 07/25/2016 22:34    EKG:   Orders placed or performed during the hospital encounter of 01/25/15  . EKG 12-Lead  . EKG 12-Lead  . EKG    ASSESSMENT AND PLAN:  1.clinical sepsis present on admission secondary to pneumonia, influenza:  #2 acute respiratory failure with hypoxia secondary to pneumonia, influenza: , continue Tamiflu, continue Levaquin, added Solu-Medrol because of wheezing, continue nebulizers, discussed with patient's daughter,  monitor respiratory status patient slightly better today, wean down o2 as tolerated to RA.   #2 diabetes mellitus type 2: Continue SSI. High blood sugar secondary to steroids. Add small dose Lantus while on steroids..  #3 elevated troponins due to demand ischemia from respiratory status. Has diastolic dysfunction. Patient needed Lasix this morning because of    Pulmonary vascular congestion on chest x-ray. Spoke with cardiology.  4. History of previous stroke without any new focal neurological deficit. Continue aspirin, statins, Plavix,  All the records are reviewed and case discussed with Care Management/Social Workerr. Management plans discussed with the patient, family and they are in agreement.  CODE STATUS: Full  TOTAL TIME TAKING CARE OF THIS PATIENT: 35 minutes.   POSSIBLE D/C IN 1-2DAYS, DEPENDING ON CLINICAL CONDITION.   Katha HammingKONIDENA,Thomasina Housley M.D on 07/26/2016 at 2:29 PM  Between 7am to 6pm - Pager - [phone]  After 6pm go to [email or web address] - password [email or web address] [facility]  [facility]  Office  [phone]  CC: Primary care physician; No PCP Per Patient   Note: This dictation was prepared with Dragon dictation along with smaller phrase technology. Any transcriptional errors that result from this process are unintentional.

## 2016-07-26 NOTE — Progress Notes (Signed)
Progress Note  Patient Name: Jo Moreno Date of Encounter: 07/26/2016  Primary Cardiologist: New  Subjective   CP when coughs/breathes  Still SOB    Inpatient Medications    Scheduled Meds: . albuterol  2.5 mg Inhalation Q4H  . aspirin EC  325 mg Oral Daily  . atorvastatin  40 mg Oral Daily  . clopidogrel  75 mg Oral Daily  . enoxaparin (LOVENOX) injection  40 mg Subcutaneous Q24H  . guaiFENesin  600 mg Oral BID  . hydrALAZINE  25 mg Oral TID  . insulin aspart  0-9 Units Subcutaneous TID WC  . levofloxacin  250 mg Oral Daily  . metFORMIN  500 mg Oral Q breakfast  . methylPREDNISolone (SOLU-MEDROL) injection  60 mg Intravenous Q4H  . metoprolol tartrate  12.5 mg Oral BID  . oseltamivir  30 mg Oral BID  . pantoprazole  40 mg Oral Daily  . sertraline  25 mg Oral Daily  . sodium chloride flush  3 mL Intravenous Q12H  . sodium chloride flush  3 mL Intravenous Q12H  . vitamin B-12  1,000 mcg Oral Daily   Continuous Infusions:  PRN Meds: sodium chloride, acetaminophen, guaiFENesin, HYDROcodone-acetaminophen, ondansetron **OR** ondansetron (ZOFRAN) IV, ondansetron, phenol, sodium chloride flush   Vital Signs    Vitals:   07/25/16 2324 07/26/16 0601 07/26/16 0719 07/26/16 0742  BP:  (!) 143/69 (!) 141/60   Pulse:  71 69   Resp:  16    Temp:  98.6 F (37 C) 97.4 F (36.3 C)   TempSrc:   Oral   SpO2: 93% 98% 97% 97%  Weight:      Height:        Intake/Output Summary (Last 24 hours) at 07/26/16 0945 Last data filed at 07/26/16 0733  Gross per 24 hour  Intake              240 ml  Output              950 ml  Net             -710 ml   Filed Weights   07/24/16 1500 07/24/16 2037  Weight: 172 lb (78 kg) 173 lb 11.2 oz (78.8 kg)    Telemetry    SR    ECG      Physical Exam   GEN: Pt appears uncomfortable but in no acute distress.  Neck: JVP is increased   Cardiac: RRR, no murmurs, rubs, or gallops.  Respiratory:BIlateral rhonchi and wheeze   GI: Soft,  nontender, non-distended  MS: No edema; No deformity. Neuro:  AAOx3. Psych: Normal affect  Labs    Chemistry Recent Labs Lab 07/24/16 1115 07/25/16 0820  NA 135 136  K 3.8 3.9  CL 97* 98*  CO2 30 34*  GLUCOSE 227* 127*  BUN 12 12  CREATININE 0.69 0.58  CALCIUM 8.1* 8.0*  PROT 7.5  --   ALBUMIN 3.6  --   AST 50*  --   ALT 30  --   ALKPHOS 54  --   BILITOT 0.3  --   GFRNONAA >60 >60  GFRAA >60 >60  ANIONGAP 8 4*     Hematology Recent Labs Lab 07/24/16 1115 07/25/16 0820  WBC 7.0 10.1  RBC 4.50 4.15  HGB 13.1 12.2  HCT 38.3 35.6  MCV 85.1 85.9  MCH 29.1 29.4  MCHC 34.2 34.3  RDW 13.9 14.3  PLT 178 136*    Cardiac Enzymes Recent Labs Lab  07/24/16 1115 07/24/16 2035 07/25/16 0236 07/25/16 0820  TROPONINI 0.43* 0.89* 0.66* 0.46*   No results for input(s): TROPIPOC in the last 168 hours.   BNP Recent Labs Lab 07/24/16 1115  BNP 124.0*     DDimer No results for input(s): DDIMER in the last 168 hours.   Radiology    Dg Chest 2 View  Result Date: 07/25/2016 CLINICAL DATA:  Shortness of breath.  Diagnosed with flu yesterday. EXAM: CHEST  2 VIEW COMPARISON:  Radiographs yesterday. FINDINGS: Stable cardiomegaly and mediastinal contours. Small left pleural effusion and fluid in the fissures. Worsening vascular congestion with new peribronchial thickening. No confluent airspace disease. No pneumothorax. Overlying monitoring devices project over the thorax. IMPRESSION: Worsening vascular congestion. New peribronchial thickening may be pulmonary edema or infectious/inflammatory. Cardiomegaly and left pleural effusion are unchanged. Electronically Signed   By: Rubye OaksMelanie  Ehinger M.D.   On: 07/25/2016 22:34   Dg Chest Port 1 View  Result Date: 07/24/2016 CLINICAL DATA:  Shortness of breath, hypoxia EXAM: PORTABLE CHEST 1 VIEW COMPARISON:  01/25/2015 FINDINGS: Cardiomegaly again noted. Central mild vascular congestion without convincing pulmonary edema. Probable  small left pleural effusion left basilar atelectasis. No segmental infiltrate. IMPRESSION: Central mild vascular congestion without convincing pulmonary edema. Probable small left pleural effusion left basilar atelectasis. No segmental infiltrate. Electronically Signed   By: Natasha MeadLiviu  Pop M.D.   On: 07/24/2016 12:15    Cardiac Studies   Echo:  1/20 LVEF normal RVEF normal    Patient Profile     83 y.o. female  History of CVA, DM admitted with SOB / achiness/fever .   Assessment & Plan    1  Elevated troponin  Most likely demand ischemia in setting of acute illness   Echo yesterday showed LVEF normal  On exam  JVP is mildly increased  I would recomm lasxi x 1  Follow response    2  ID  Continue Rx   Per IM    Signed, Dietrich PatesPaula Andersyn Fragoso, MD  07/26/2016, 9:45 AM

## 2016-07-27 LAB — GLUCOSE, CAPILLARY
GLUCOSE-CAPILLARY: 216 mg/dL — AB (ref 65–99)
GLUCOSE-CAPILLARY: 291 mg/dL — AB (ref 65–99)
GLUCOSE-CAPILLARY: 257 mg/dL — AB (ref 65–99)
GLUCOSE-CAPILLARY: 144 mg/dL — AB (ref 65–99)

## 2016-07-27 LAB — CULTURE, RESPIRATORY W GRAM STAIN: Culture: NORMAL

## 2016-07-27 LAB — CULTURE, RESPIRATORY

## 2016-07-27 MED ORDER — FUROSEMIDE 10 MG/ML IJ SOLN: 40.0000 mg | Freq: Once | INTRAMUSCULAR | Status: DC

## 2016-07-27 MED ORDER — POTASSIUM CHLORIDE CRYS ER 20 MEQ PO TBCR
40.0000 meq | EXTENDED_RELEASE_TABLET | Freq: Once | ORAL | Status: AC
  Administered 2016-07-27: 40 meq via ORAL
  Filled 2016-07-27: qty 2

## 2016-07-27 MED ORDER — INSULIN ASPART 100 UNIT/ML ~~LOC~~ SOLN
0.0000 [IU] | Freq: Three times a day (TID) | SUBCUTANEOUS | Status: DC
  Administered 2016-07-27: 8 [IU] via SUBCUTANEOUS
  Administered 2016-07-28: 3 [IU] via SUBCUTANEOUS
  Administered 2016-07-28: 5 [IU] via SUBCUTANEOUS
  Administered 2016-07-28: 15 [IU] via SUBCUTANEOUS
  Administered 2016-07-29: 11 [IU] via SUBCUTANEOUS
  Administered 2016-07-29: 2 [IU] via SUBCUTANEOUS
  Administered 2016-07-30: 8 [IU] via SUBCUTANEOUS
  Administered 2016-07-30: 3 [IU] via SUBCUTANEOUS
  Filled 2016-07-27 (×2): qty 3
  Filled 2016-07-27: qty 15
  Filled 2016-07-27: qty 11
  Filled 2016-07-27 (×2): qty 8
  Filled 2016-07-27: qty 5
  Filled 2016-07-27: qty 2

## 2016-07-27 MED ORDER — POLYETHYLENE GLYCOL 3350 17 G PO PACK
17.0000 g | PACK | Freq: Every day | ORAL | Status: DC | PRN
  Administered 2016-07-27 – 2016-07-28 (×2): 17 g via ORAL
  Filled 2016-07-27 (×2): qty 1

## 2016-07-27 MED ORDER — METHYLPREDNISOLONE SODIUM SUCC 40 MG IJ SOLR
40.0000 mg | Freq: Two times a day (BID) | INTRAMUSCULAR | Status: DC
  Administered 2016-07-27 – 2016-07-30 (×7): 40 mg via INTRAVENOUS
  Filled 2016-07-27 (×7): qty 1

## 2016-07-27 MED ORDER — FUROSEMIDE 10 MG/ML IJ SOLN
40.0000 mg | Freq: Two times a day (BID) | INTRAMUSCULAR | Status: DC
  Administered 2016-07-27 (×2): 40 mg via INTRAVENOUS
  Filled 2016-07-27 (×3): qty 4

## 2016-07-27 MED ORDER — INSULIN ASPART 100 UNIT/ML ~~LOC~~ SOLN: 0.0000 [IU] | Freq: Every day | SUBCUTANEOUS | Status: DC

## 2016-07-27 MED ORDER — INSULIN GLARGINE 100 UNIT/ML ~~LOC~~ SOLN
8.0000 [IU] | Freq: Every day | SUBCUTANEOUS | Status: DC
  Administered 2016-07-27 – 2016-07-29 (×3): 8 [IU] via SUBCUTANEOUS
  Filled 2016-07-27 (×5): qty 0.08

## 2016-07-27 NOTE — Progress Notes (Signed)
Inpatient Diabetes Program Recommendations  AACE/ADA: New Consensus Statement on Inpatient Glycemic Control (2015)  Target Ranges:  Prepandial:   less than 140 mg/dL      Peak postprandial:   less than 180 mg/dL (1-2 hours)      Critically ill patients:  140 - 180 mg/dL   Results for Sherryl MangesBRAGA, Arabell (MRN [id number]) as of 07/27/2016 09:56  Ref. Range 07/26/2016 07:34 07/26/2016 11:45 07/26/2016 16:51 07/26/2016 21:01  Glucose-Capillary Latest Ref Range: 65 - 99 mg/dL 409198 (H) 811345 (H) 914203 (H) 198 (H)   Results for Sherryl MangesBRAGA, Ardythe (MRN [id number]) as of 07/27/2016 09:56  Ref. Range 07/27/2016 07:34  Glucose-Capillary Latest Ref Range: 65 - 99 mg/dL 086216 (H)    Admit with: Sepsis/ Influenza  History: DM  Home DM Meds: Metformin 500 mg daily  Current Insulin Orders: Lantus 5 units QHS      Novolog Sensitive Correction Scale/ SSI (0-9 units) TID AC      Metformin 500 mg daily      MD- Note Solumedrol reduced to 40 mg BID yesterday.  Also note Lantus 5 units QHS started last PM.  CBG still elevated this AM.  MD- Please consider the following in-hospital insulin adjustments while patient remains on IV steroids:  1. Increase Lantus slightly to 8 units QHS (0.1 units/kg dosing based on weight of 78 kg)  2. Increase Novolog Correction Scale/ SSI to Moderate scale (0-15 units) TID AC + HS     --Will follow patient during hospitalization--  Ambrose FinlandJeannine Johnston Odeal Welden RN, MSN, CDE Diabetes Coordinator Inpatient Glycemic Control Team Team Pager: [phone] (8a-5p)

## 2016-07-27 NOTE — Care Management Important Message (Signed)
Important Message  Patient Details  Name: Jo MangesMaria Moreno MRN: [id number] Date of Birth: 1925/01/13   Medicare Important Message Given:  Yes    Gwenette GreetBrenda S Kitti Mcclish, RN 07/27/2016, 12:14 PM

## 2016-07-27 NOTE — Plan of Care (Signed)
Problem: Safety: Goal: Ability to remain free from injury will improve Outcome: Progressing Re-educated on the importance of calling whenever she needs assistance out of bed.

## 2016-07-27 NOTE — Procedures (Signed)
Flutter Valve given to patient

## 2016-07-27 NOTE — Progress Notes (Signed)
[facility] - [place] at [facility]   PATIENT NAME: Jo Moreno    MR#:  [id number]  DATE OF BIRTH:  May 26, 1925    CHIEF COMPLAINT:   Chief Complaint  Patient presents with  . Code Sepsis   Continues to have shortness of breath, cough and wheezing. Positive for orthopnea. Feels weak. Afebrile.  Patient's daughter acted as interpreter as per patient's request.  REVIEW OF SYSTEMS:   Review of Systems  Constitutional: Negative for chills and fever.  HENT: Negative for congestion, hearing loss and sore throat.   Eyes: Negative for blurred vision, double vision and photophobia.  Respiratory: Positive for cough, shortness of breath and wheezing. Negative for hemoptysis.   Cardiovascular: Negative for palpitations, orthopnea and leg swelling.  Gastrointestinal: Negative for abdominal pain, diarrhea and vomiting.  Genitourinary: Negative for dysuria and urgency.  Musculoskeletal: Negative for myalgias and neck pain.  Skin: Negative for rash.  Neurological: Negative for dizziness, focal weakness, seizures, weakness and headaches.  Psychiatric/Behavioral: Negative for memory loss. The patient does not have insomnia.    DRUG ALLERGIES:   Allergies  Allergen Reactions  . Aspirin Other (See Comments)    Stomach irritation  . 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    VITALS:  Blood pressure (!) 126/45, pulse 69, temperature 97.3 F (36.3 C), temperature source Oral, resp. rate 18, height 5\' 2"  (1.575 m), weight 78.8 kg (173 lb 11.2 oz), SpO2 97 %.  PHYSICAL EXAMINATION:  GENERAL:  83 y.o.-year-old patient lying in the bed with Conversational dyspnea EYES: Pupils equal, round, reactive to light and accommodation. No scleral icterus. Extraocular muscles intact.  HEENT: Head atraumatic, normocephalic. Oropharynx and nasopharynx clear. Clinically dry mucosa. NECK:  Supple, no  jugular venous distention. No thyroid enlargement, no tenderness.  LUNGS: bilateral expiratory wheezing with coarse breath sounds CARDIOVASCULAR: S1, S2 normal. No murmurs, rubs, or gallops.  ABDOMEN: Soft, nontender, nondistended. Bowel sounds present. No organomegaly or mass.  EXTREMITIES: No pedal edema, cyanosis, or clubbing.  NEUROLOGIC: Cranial nerves II through XII are intact. Muscle strength 5/5 in all extremities. Sensation intact. Gait not checked.  PSYCHIATRIC: The patient is alert and oriented x 3.  SKIN: No obvious rash, lesion, or ulcer.   LABORATORY PANEL:   CBC  Recent Labs Lab 07/25/16 0820  WBC 10.1  HGB 12.2  HCT 35.6  PLT 136*   ------------------------------------------------------------------------------------------------------------------  Chemistries   Recent Labs Lab 07/24/16 1115 07/25/16 0820  NA 135 136  K 3.8 3.9  CL 97* 98*  CO2 30 34*  GLUCOSE 227* 127*  BUN 12 12  CREATININE 0.69 0.58  CALCIUM 8.1* 8.0*  AST 50*  --   ALT 30  --   ALKPHOS 54  --   BILITOT 0.3  --    ------------------------------------------------------------------------------------------------------------------  Cardiac Enzymes  Recent Labs Lab 07/25/16 0820  TROPONINI 0.46*   ------------------------------------------------------------------------------------------------------------------  RADIOLOGY:  Dg Chest 2 View  Result Date: 07/25/2016 CLINICAL DATA:  Shortness of breath.  Diagnosed with flu yesterday. EXAM: CHEST  2 VIEW COMPARISON:  Radiographs yesterday. FINDINGS: Stable cardiomegaly and mediastinal contours. Small left pleural effusion and fluid in the fissures. Worsening vascular congestion with new peribronchial thickening. No confluent airspace disease. No pneumothorax. Overlying monitoring devices project over the thorax. IMPRESSION: Worsening vascular congestion. New peribronchial thickening may be pulmonary edema or infectious/inflammatory.  Cardiomegaly and left pleural effusion are unchanged. Electronically Signed  By: Rubye OaksMelanie  Ehinger M.D.   On: 07/25/2016 22:34    EKG:   Orders placed or performed during the hospital encounter of 01/25/15  . EKG 12-Lead  . EKG 12-Lead  . EKG    ASSESSMENT AND PLAN:   * Acute hypoxic respiratory failure due to influenza A pneumonitis and acute on chronic diastolic CHF Nebulizers as needed. Wean oxygen as tolerated. On 3 L oxygen today.  * Influenza A pneumonitis -IV steroids, Tamiflu - Scheduled Nebulizers - Inhalers - Consult pulmonary if no improvement  * Acute on chronic diastolic CHF Start IV Lasix. Monitor input and output. Daily weight. Repeat potassium, BUN and creatinine tomorrow. Mild elevation troponin due to CHF and influenza  * Diabetes mellitus type 2 Sliding scale insulin  * DVT prophylaxis with Lovenox  All the records are reviewed and case discussed with Care Management/Social Workerr. Management plans discussed with the patient, family and they are in agreement.  CODE STATUS: Full  TOTAL TIME TAKING CARE OF THIS PATIENT: 35 minutes.   POSSIBLE D/C IN 1-2 DAYS, DEPENDING ON CLINICAL CONDITION.   Milagros LollSudini, Johnette Teigen R M.D on 07/27/2016 at 1:32 PM  Between 7am to 6pm - Pager - [phone]  After 6pm go to [email or web address] - password [email or web address] [facility]  [facility]  Office  [phone]  CC: Primary care physician; No PCP Per Patient  Note: This dictation was prepared with Dragon dictation along with smaller phrase technology. Any transcriptional errors that result from this process are unintentional.

## 2016-07-28 ENCOUNTER — Inpatient Hospital Stay: Payer: Medicare Other

## 2016-07-28 LAB — BASIC METABOLIC PANEL WITH GFR
Anion gap: 7 (ref 5–15)
BUN: 36 mg/dL — ABNORMAL HIGH (ref 6–20)
CO2: 40 mmol/L — ABNORMAL HIGH (ref 22–32)
Calcium: 8.4 mg/dL — ABNORMAL LOW (ref 8.9–10.3)
Chloride: 90 mmol/L — ABNORMAL LOW (ref 101–111)
Creatinine, Ser: 0.74 mg/dL (ref 0.44–1.00)
GFR calc Af Amer: >60 mL/min
GFR calc non Af Amer: >60 mL/min
Glucose, Bld: 224 mg/dL — ABNORMAL HIGH (ref 65–99)
Potassium: 4.5 mmol/L (ref 3.5–5.1)
Sodium: 137 mmol/L (ref 135–145)

## 2016-07-28 LAB — GLUCOSE, CAPILLARY
GLUCOSE-CAPILLARY: 204 mg/dL — AB (ref 65–99)
GLUCOSE-CAPILLARY: 198 mg/dL — AB (ref 65–99)
Glucose-Capillary: 212 mg/dL — ABNORMAL HIGH (ref 65–99)
Glucose-Capillary: 382 mg/dL — ABNORMAL HIGH (ref 65–99)
Glucose-Capillary: 170 mg/dL — ABNORMAL HIGH (ref 65–99)

## 2016-07-28 MED ORDER — LEVOFLOXACIN 500 MG PO TABS
250.0000 mg | ORAL_TABLET | Freq: Every day | ORAL | Status: AC
  Administered 2016-07-29 – 2016-07-30 (×2): 250 mg via ORAL
  Filled 2016-07-28 (×2): qty 1

## 2016-07-28 MED ORDER — INSULIN ASPART 100 UNIT/ML ~~LOC~~ SOLN
4.0000 [IU] | Freq: Three times a day (TID) | SUBCUTANEOUS | Status: DC
  Administered 2016-07-28 – 2016-07-30 (×6): 4 [IU] via SUBCUTANEOUS
  Filled 2016-07-28 (×6): qty 4

## 2016-07-28 NOTE — Plan of Care (Signed)
Problem: Physical Regulation: Goal: Ability to maintain clinical measurements within normal limits will improve Outcome: Progressing Continues to be SOB, requiring oxygen.  [facility]  Continue to monitor

## 2016-07-28 NOTE — Progress Notes (Signed)
Patient wears depends at home. Daughter and patient want to continue to wear them.  Daughter brings them in.

## 2016-07-28 NOTE — Evaluation (Signed)
Physical Therapy Evaluation Patient Details Name: Jo Moreno MRN: [id number] DOB: 1925-06-16 Today's Date: 07/28/2016   History of Present Illness  presented to ER secondary to SOB, generalized body aches and weakness; admitted with sepsis related to influenza, PNA.  Clinical Impression  Upon evaluation, patient alert and oriented; follows simple commands and demonstrates fair/good insight into safety needs.  Bilat UE/LE strength and ROM grossly symmetrical and WFL for basic transfers and mobility.  Able to complete bed mobility with mod indep; sit/stand, basic transfers and gait (40' x2), cga/min assist.  Mobility assessed with both SPC and RW.  Optimal safety/indep with use of RW; however, patient prefers continued use of SPC (baseline assist device) at this time. Mild sway, antalgic gait pattern noted with use of SPC; however, no overt buckling or LOB, no significant change in level of assist required with mobility. Sats maintained >90% on 3L supplemental O2 at rest and with activity.  Will continue mobility progression and attempt to wean O2 as medically appropriate/directed.  Potential for home O2 needs upon discharge noted. Would benefit from skilled PT to address above deficits and promote optimal return to PLOF; Recommend transition to HHPT upon discharge from acute hospitalization.  Speaks [place].  Language line interpreter available throughout session, ID [id number].  Daughter prefers to translate; interpreter confirmed accuracy.    Follow Up Recommendations Home health PT    Equipment Recommendations       Recommendations for Other Services       Precautions / Restrictions Precautions Precautions: Fall Precaution Comments: Droplet iso Restrictions Weight Bearing Restrictions: No      Mobility  Bed Mobility Overal bed mobility: Modified Independent                Transfers Overall transfer level: Needs assistance Equipment used: Rolling walker (2  wheeled) Transfers: Sit to/from Stand Sit to Stand: Min guard;Min assist            Ambulation/Gait Ambulation/Gait assistance: Min guard;Min assist Ambulation Distance (Feet): 40 Feet Assistive device: Rolling walker (2 wheeled)       General Gait Details: reciprocal stepping pattern within room environment.  Able to negotiate turns, obstacles without difficulty. Maintains sats >90% on 3L supplemental O2 throughout efforts.  Stairs            Wheelchair Mobility    Modified Rankin (Stroke Patients Only)       Balance Overall balance assessment: Needs assistance Sitting-balance support: No upper extremity supported;Feet supported Sitting balance-Leahy Scale: Good     Standing balance support: Bilateral upper extremity supported Standing balance-Leahy Scale: Fair                               Pertinent Vitals/Pain Pain Assessment: No/denies pain    Home Living Family/patient expects to be discharged to:: Private residence Living Arrangements: Children Available Help at Discharge: Family;Available 24 hours/day Type of Home: House Home Access: Stairs to enter Entrance Stairs-Rails: Can reach both Entrance Stairs-Number of Steps: 7-8 Home Layout: One level Home Equipment: Cane - single point      Prior Function Level of Independence: Needs assistance         Comments: Ambulatory for household, community distances with [facility]; sup/assist from family for ADLs, mobility and household chores as needed.  No home O2.  Does endorse 2 falls within previous six months.     Hand Dominance        Extremity/Trunk Assessment  Upper Extremity Assessment Upper Extremity Assessment: Overall WFL for tasks assessed    Lower Extremity Assessment Lower Extremity Assessment: Overall WFL for tasks assessed (grossly at least 4/5 throughout)       Communication   Communication: No difficulties (Speaks [place].  Language line interpreter available  throughout session, ID [id number].  Daughter prefers to translate; interpreter confirmed accuracy.)  Cognition Arousal/Alertness: Awake/alert Behavior During Therapy: WFL for tasks assessed/performed Overall Cognitive Status: Within Functional Limits for tasks assessed                      General Comments      Exercises Other Exercises Other Exercises: 5640' with SPC, cga/min assist-slightly increase in sway, mildly antalgic with decreased stance time L LE; however, patient prefers continued use (and reports optimal comfort) with use of SPC.  Does have RW available upon discharge if needed.   Assessment/Plan    PT Assessment Patient needs continued PT services  PT Problem List Decreased strength;Decreased range of motion;Decreased activity tolerance;Decreased balance;Decreased mobility;Decreased knowledge of use of DME;Decreased safety awareness;Decreased knowledge of precautions;Cardiopulmonary status limiting activity          PT Treatment Interventions DME instruction;Stair training;Gait training;Functional mobility training;Therapeutic activities;Therapeutic exercise;Balance training;Patient/family education    PT Goals (Current goals can be found in the Care Plan section)  Acute Rehab PT Goals Patient Stated Goal: to return home, [place] Friday per daughter PT Goal Formulation: With patient/family Time For Goal Achievement: 08/11/16 Potential to Achieve Goals: Good    Frequency Min 2X/week   Barriers to discharge        Co-evaluation               End of Session Equipment Utilized During Treatment: Gait belt Activity Tolerance: Patient tolerated treatment well Patient left: in chair;with call bell/phone within reach;with chair alarm set;with family/visitor present Nurse Communication: Mobility status         Time: [id number] PT Time Calculation (min) (ACUTE ONLY): 26 min   Charges:   PT Evaluation $PT Eval Low  Complexity: 1 Procedure PT Treatments $Gait Training: 8-22 mins   PT G Codes:        Zachary Lovins H. Manson PasseyBrown, PT, DPT, NCS 07/28/16, 11:13 AM [phone]

## 2016-07-28 NOTE — Progress Notes (Signed)
[facility] - [place] at [facility]   PATIENT NAME: Jo Moreno    MR#:  [id number]  DATE OF BIRTH:  1925/04/16    CHIEF COMPLAINT:   Chief Complaint  Patient presents with  . Code Sepsis   Continues to have shortness of breath, cough and wheezing - Improving slowly Positive for orthopnea. Feels weak but better. Afebrile.  Patient's daughter acted as interpreter as per patient's request.  REVIEW OF SYSTEMS:   Review of Systems  Constitutional: Negative for chills and fever.  HENT: Negative for congestion, hearing loss and sore throat.   Eyes: Negative for blurred vision, double vision and photophobia.  Respiratory: Positive for cough, shortness of breath and wheezing. Negative for hemoptysis.   Cardiovascular: Negative for palpitations, orthopnea and leg swelling.  Gastrointestinal: Negative for abdominal pain, diarrhea and vomiting.  Genitourinary: Negative for dysuria and urgency.  Musculoskeletal: Negative for myalgias and neck pain.  Skin: Negative for rash.  Neurological: Negative for dizziness, focal weakness, seizures, weakness and headaches.  Psychiatric/Behavioral: Negative for memory loss. The patient does not have insomnia.    DRUG ALLERGIES:   Allergies  Allergen Reactions  . Aspirin Other (See Comments)    Stomach irritation  . 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    VITALS:  Blood pressure (!) 132/46, pulse 76, temperature 98.2 F (36.8 C), resp. rate (!) 24, height 5\' 2"  (1.575 m), weight 78.8 kg (173 lb 11.2 oz), SpO2 91 %.  PHYSICAL EXAMINATION:  GENERAL:  83 y.o.-year-old patient lying in the bed with Conversational dyspnea EYES: Pupils equal, round, reactive to light and accommodation. No scleral icterus. Extraocular muscles intact.  HEENT: Head atraumatic, normocephalic. Oropharynx and nasopharynx clear. Clinically dry mucosa. NECK:   Supple, no jugular venous distention. No thyroid enlargement, no tenderness.  LUNGS: bilateral expiratory wheezing with coarse breath sounds CARDIOVASCULAR: S1, S2 normal. No murmurs, rubs, or gallops.  ABDOMEN: Soft, nontender, nondistended. Bowel sounds present. No organomegaly or mass.  EXTREMITIES: No pedal edema, cyanosis, or clubbing.  NEUROLOGIC: Cranial nerves II through XII are intact. Muscle strength 5/5 in all extremities. Sensation intact. Gait not checked.  PSYCHIATRIC: The patient is alert and oriented x 3.  SKIN: No obvious rash, lesion, or ulcer.   LABORATORY PANEL:   CBC  Recent Labs Lab 07/25/16 0820  WBC 10.1  HGB 12.2  HCT 35.6  PLT 136*   ------------------------------------------------------------------------------------------------------------------  Chemistries   Recent Labs Lab 07/24/16 1115  07/28/16 0506  NA 135  < > 137  K 3.8  < > 4.5  CL 97*  < > 90*  CO2 30  < > 40*  GLUCOSE 227*  < > 224*  BUN 12  < > 36*  CREATININE 0.69  < > 0.74  CALCIUM 8.1*  < > 8.4*  AST 50*  --   --   ALT 30  --   --   ALKPHOS 54  --   --   BILITOT 0.3  --   --   < > = values in this interval not displayed. ------------------------------------------------------------------------------------------------------------------  Cardiac Enzymes  Recent Labs Lab 07/25/16 0820  TROPONINI 0.46*   ------------------------------------------------------------------------------------------------------------------  RADIOLOGY:  Dg Chest 2 View  Result Date: 07/28/2016 CLINICAL DATA:  Followup congestive heart failure, cough and congestion EXAM: CHEST  2 VIEW COMPARISON:  Chest x-ray of 07/25/2016 FINDINGS: There is blunting of the left costophrenic angle which  may be due to small pleural effusion or pleural thickening. No pneumonia is seen. Mediastinal and hilar contours are unremarkable. Moderate cardiomegaly is stable. The bones are diffusely osteopenic and there are  degenerative changes throughout the thoracic spine. IMPRESSION: 1. Stable cardiomegaly. 2. Left pleural thickening versus tiny left pleural effusion. Electronically Signed   By: Dwyane DeePaul  Barry M.D.   On: 07/28/2016 10:43    EKG:   Orders placed or performed during the hospital encounter of 01/25/15  . EKG 12-Lead  . EKG 12-Lead  . EKG    ASSESSMENT AND PLAN:   * Acute hypoxic respiratory failure due to influenza A pneumonitis and acute on chronic diastolic CHF Nebulizers as needed. Wean oxygen as tolerated. On 3 L oxygen today.  * Influenza A pneumonitis -IV steroids, Tamiflu - Scheduled Nebulizers - Inhalers  * Acute on chronic diastolic CHF Started IV Lasix. Monitor input and output. Daily weight. Repeat potassium, BUN and creatinine tomorrow. Mild elevation troponin due to CHF and influenza  Stop lasix today Repeat CXR  * Diabetes mellitus type 2 Sliding scale insulin  * DVT prophylaxis with Lovenox  All the records are reviewed and case discussed with Care Management/Social Workerr. Management plans discussed with the patient, family and they are in agreement.  CODE STATUS: Full  TOTAL TIME TAKING CARE OF THIS PATIENT: 35 minutes.   POSSIBLE D/C IN 1-2 DAYS, DEPENDING ON CLINICAL CONDITION.   Milagros LollSudini, Jasiel Apachito R M.D on 07/28/2016 at 12:23 PM  Between 7am to 6pm - Pager - [phone]  After 6pm go to [email or web address] - password [email or web address] [facility]  [place] [place] [facility]  Office  [phone]  CC: Primary care physician; No PCP Per Patient  Note: This dictation was prepared with Dragon dictation along with smaller phrase technology. Any transcriptional errors that result from this process are unintentional.

## 2016-07-28 NOTE — Progress Notes (Signed)
Inpatient Diabetes Program Recommendations  AACE/ADA: New Consensus Statement on Inpatient Glycemic Control (2015)  Target Ranges:  Prepandial:   less than 140 mg/dL      Peak postprandial:   less than 180 mg/dL (1-2 hours)      Critically ill patients:  140 - 180 mg/dL   Lab Results  Component Value Date   GLUCAP 382 (H) 07/28/2016    Results for Jo MangesBRAGA, [name] (MRN [id number]) as of 07/28/2016 11:04  Ref. Range 07/27/2016 16:57 07/27/2016 20:41 07/28/2016 05:38 07/28/2016 07:46 07/28/2016 10:54  Glucose-Capillary Latest Ref Range: 65 - 99 mg/dL 469257 (H) 629144 (H) 528212 (H) 204 (H) 382 (H)   History: DM  Home DM Meds: Metformin 500 mg daily  Current Insulin Orders: Lantus 5 units QHS                                       Novolog Sensitive Correction Scale/ SSI (0-15 units) TID AC                                       Metformin 500 mg daily    Lantus 5 units QHS started last PM, steroids continue bid. CBG still elevated this AM.  Consider adding Novolog 4 units tid with meals.  Susette RacerJulie Lari Linson, RN, BA, MHA, CDE Diabetes Coordinator Inpatient Diabetes Program  [phone] (Team Pager) [phone] [facility] Office) 07/28/2016 11:10 AM

## 2016-07-29 LAB — CBC
HCT: 35.5 % (ref 35.0–47.0)
HEMOGLOBIN: 12 g/dL (ref 12.0–16.0)
MCH: 28.9 pg (ref 26.0–34.0)
MCHC: 33.8 g/dL (ref 32.0–36.0)
MCV: 85.4 fL (ref 80.0–100.0)
Platelets: 208 10*3/uL (ref 150–440)
RBC: 4.16 MIL/uL (ref 3.80–5.20)
RDW: 13.7 % (ref 11.5–14.5)
WBC: 9.3 10*3/uL (ref 3.6–11.0)

## 2016-07-29 LAB — CULTURE, BLOOD (ROUTINE X 2)
Culture: NO GROWTH
Culture: NO GROWTH

## 2016-07-29 LAB — GLUCOSE, CAPILLARY
GLUCOSE-CAPILLARY: 150 mg/dL — AB (ref 65–99)
GLUCOSE-CAPILLARY: 112 mg/dL — AB (ref 65–99)
Glucose-Capillary: 321 mg/dL — ABNORMAL HIGH (ref 65–99)
Glucose-Capillary: 143 mg/dL — ABNORMAL HIGH (ref 65–99)

## 2016-07-29 MED ORDER — NYSTATIN 100000 UNIT/ML MT SUSP
5.0000 mL | Freq: Four times a day (QID) | OROMUCOSAL | Status: DC
  Administered 2016-07-29 – 2016-07-30 (×2): 500000 [IU] via OROMUCOSAL
  Filled 2016-07-29 (×3): qty 5

## 2016-07-29 MED ORDER — FUROSEMIDE 40 MG PO TABS
40.0000 mg | ORAL_TABLET | Freq: Every day | ORAL | Status: DC
  Administered 2016-07-29 – 2016-07-30 (×2): 40 mg via ORAL
  Filled 2016-07-29 (×2): qty 1

## 2016-07-29 MED ORDER — BISACODYL 5 MG PO TBEC
10.0000 mg | DELAYED_RELEASE_TABLET | Freq: Every day | ORAL | Status: AC
  Administered 2016-07-29 – 2016-07-30 (×2): 10 mg via ORAL
  Filled 2016-07-29 (×2): qty 2

## 2016-07-29 MED ORDER — SENNOSIDES-DOCUSATE SODIUM 8.6-50 MG PO TABS
2.0000 | ORAL_TABLET | Freq: Two times a day (BID) | ORAL | Status: DC
  Administered 2016-07-29 – 2016-07-30 (×3): 2 via ORAL
  Filled 2016-07-29 (×3): qty 2

## 2016-07-29 NOTE — Care Management Important Message (Signed)
Important Message  Patient Details  Name: Jo Moreno MRN: [id number] Date of Birth: 1924-10-20   Medicare Important Message Given:  Yes Initial signed IM printed from Epic and given to patient.   Eber HongGreene, Tacha Manni R, RN 07/29/2016, 3:01 PM

## 2016-07-29 NOTE — Progress Notes (Signed)
Nutrition Follow-up  DOCUMENTATION CODES:   Not applicable  INTERVENTION:  1. Continue Magic cup TID with meals, each supplement provides 290 kcal and 9 grams of protein -Continue Snacks  NUTRITION DIAGNOSIS:   Inadequate oral intake related to acute illness as evidenced by per patient/family report. -ongoing  GOAL:   Patient will meet greater than or equal to 90% of their needs -meeting  MONITOR:   PO intake, Supplement acceptance  ASSESSMENT:   83 yo with hsitory of CVA, type II DM admitted yesterday with SOB and achiness. Admitted for flu, PNA w/ sepsis  Sleeping soundly during visit. per daughter, patient has been eating well. Had Pancakes and something else this morning (daughter couldn't remember.) Documented meal completion 80-100% thus far. No new weights. Consuming magic cups. Labs and medications reviewed: CBGs 150-321 Solumedrol, Senokot-S, B12, Colace  Diet Order:  Diet heart healthy/carb modified Room service appropriate? Yes; Fluid consistency: Thin  Skin:  Reviewed, no issues  Last BM:  1/19  Height:   Ht Readings from Last 1 Encounters:  07/24/16 5\' 2"  (1.575 m)    Weight:   Wt Readings from Last 1 Encounters:  07/24/16 173 lb 11.2 oz (78.8 kg)    Ideal Body Weight:  50 kg  BMI:  Body mass index is 31.77 kg/m.  Estimated Nutritional Needs:   Kcal:  1400-1700kcal/day   Protein:  86-102g/day   Fluid:  >1.4L/day   EDUCATION NEEDS:   No education needs identified at this time  Dionne AnoWilliam M. Vernell Back, MS, RD LDN Inpatient Clinical Dietitian Pager [phone]

## 2016-07-29 NOTE — Progress Notes (Signed)
Inpatient Diabetes Program Recommendations  AACE/ADA: New Consensus Statement on Inpatient Glycemic Control (2015)  Target Ranges:  Prepandial:   less than 140 mg/dL      Peak postprandial:   less than 180 mg/dL (1-2 hours)      Critically ill patients:  140 - 180 mg/dL  Results for Jo Moreno, [name] (MRN [id number]) as of 07/29/2016 13:40  Ref. Range 07/28/2016 05:38 07/28/2016 07:46 07/28/2016 10:54 07/28/2016 17:03 07/28/2016 21:41 07/29/2016 07:42 07/29/2016 11:35  Glucose-Capillary Latest Ref Range: 65 - 99 mg/dL 409212 (H) 811204 (H) 914382 (H) 198 (H) 170 (H) 150 (H) 321 (H)    Review of Glycemic Control  Current orders for Inpatient glycemic control: Lantus 8 units QHS, Novolog 4 units TID with meals, Novolog 0-15 units TID with meals, Novolog 0-5 units QHS, Metformin XR 500 mg  QAM  Inpatient Diabetes Program Recommendations: Insulin - Meal Coverage: If steroids are continued as ordered, please consider increasing meal coverage to Novolog 6 units TID with meals.  Thanks, Orlando PennerMarie Carylon Tamburro, RN, MSN, CDE Diabetes Coordinator Inpatient Diabetes Program [phone] (Team Pager from 8am to 5pm)

## 2016-07-29 NOTE — Progress Notes (Signed)
[facility] - [place] at [facility]   PATIENT NAME: Jo Moreno    MR#:  [id number]  DATE OF BIRTH:  [birth year]    CHIEF COMPLAINT:   Chief Complaint  Patient presents with  . Code Sepsis   Feels a little better today. On 2 L oxygen. Cough with no sputum.  Patient's daughter acted as interpreter as per patient's request.  REVIEW OF SYSTEMS:   Review of Systems  Constitutional: Negative for chills and fever.  HENT: Negative for congestion, hearing loss and sore throat.   Eyes: Negative for blurred vision, double vision and photophobia.  Respiratory: Positive for cough, shortness of breath and wheezing. Negative for hemoptysis.   Cardiovascular: Negative for palpitations, orthopnea and leg swelling.  Gastrointestinal: Negative for abdominal pain, diarrhea and vomiting.  Genitourinary: Negative for dysuria and urgency.  Musculoskeletal: Negative for myalgias and neck pain.  Skin: Negative for rash.  Neurological: Negative for dizziness, focal weakness, seizures, weakness and headaches.  Psychiatric/Behavioral: Negative for memory loss. The patient does not have insomnia.    DRUG ALLERGIES:   Allergies  Allergen Reactions  . Aspirin Other (See Comments)    Stomach irritation  . 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    VITALS:  Blood pressure (!) 124/50, pulse 72, temperature 98.2 F (36.8 C), temperature source Oral, resp. rate 18, height 5\' 2"  (1.575 m), weight 78.8 kg (173 lb 11.2 oz), SpO2 97 %.  PHYSICAL EXAMINATION:  GENERAL:  83 y.o.-year-old patient lying in the bed with Conversational dyspnea EYES: Pupils equal, round, reactive to light and accommodation. No scleral icterus. Extraocular muscles intact.  HEENT: Head atraumatic, normocephalic. Oropharynx and nasopharynx clear. Clinically dry mucosa. NECK:  Supple, no jugular venous distention. No thyroid  enlargement, no tenderness.  LUNGS: bilateral expiratory wheezing with coarse breath sounds CARDIOVASCULAR: S1, S2 normal. No murmurs, rubs, or gallops.  ABDOMEN: Soft, nontender, nondistended. Bowel sounds present. No organomegaly or mass.  EXTREMITIES: No pedal edema, cyanosis, or clubbing.  NEUROLOGIC: Cranial nerves II through XII are intact. Muscle strength 5/5 in all extremities. Sensation intact. Gait not checked.  PSYCHIATRIC: The patient is alert and oriented x 3.  SKIN: No obvious rash, lesion, or ulcer.   LABORATORY PANEL:   CBC  Recent Labs Lab 07/29/16 0436  WBC 9.3  HGB 12.0  HCT 35.5  PLT 208   ------------------------------------------------------------------------------------------------------------------  Chemistries   Recent Labs Lab 07/24/16 1115  07/28/16 0506  NA 135  < > 137  K 3.8  < > 4.5  CL 97*  < > 90*  CO2 30  < > 40*  GLUCOSE 227*  < > 224*  BUN 12  < > 36*  CREATININE 0.69  < > 0.74  CALCIUM 8.1*  < > 8.4*  AST 50*  --   --   ALT 30  --   --   ALKPHOS 54  --   --   BILITOT 0.3  --   --   < > = values in this interval not displayed. ------------------------------------------------------------------------------------------------------------------  Cardiac Enzymes  Recent Labs Lab 07/25/16 0820  TROPONINI 0.46*   ------------------------------------------------------------------------------------------------------------------  RADIOLOGY:  Dg Chest 2 View  Result Date: 07/28/2016 CLINICAL DATA:  Followup congestive heart failure, cough and congestion EXAM: CHEST  2 VIEW COMPARISON:  Chest x-ray of 07/25/2016 FINDINGS: There is blunting of the left costophrenic angle which may be due to small  pleural effusion or pleural thickening. No pneumonia is seen. Mediastinal and hilar contours are unremarkable. Moderate cardiomegaly is stable. The bones are diffusely osteopenic and there are degenerative changes throughout the thoracic spine.  IMPRESSION: 1. Stable cardiomegaly. 2. Left pleural thickening versus tiny left pleural effusion. Electronically Signed   By: Dwyane DeePaul  Barry M.D.   On: 07/28/2016 10:43    EKG:   Orders placed or performed during the hospital encounter of 01/25/15  . EKG 12-Lead  . EKG 12-Lead  . EKG    ASSESSMENT AND PLAN:   * Acute hypoxic respiratory failure due to influenza A pneumonitis and acute on chronic diastolic CHF Nebulizers as needed. Wean oxygen as tolerated. On 3 L oxygen today.  * Influenza A pneumonitis -IV steroids, Tamiflu - Scheduled Nebulizers - Inhalers  * Acute on chronic diastolic CHF Monitor input and output. Daily weight. We'll start on Lasix daily.  * Diabetes mellitus type 2 Sliding scale insulin  * DVT prophylaxis with Lovenox  All the records are reviewed and case discussed with Care Management/Social Worker Management plans discussed with the patient, family and they are in agreement.  CODE STATUS: Full  TOTAL TIME TAKING CARE OF THIS PATIENT: 35 minutes.   POSSIBLE D/C IN 1-2 DAYS, DEPENDING ON CLINICAL CONDITION.  Milagros LollSudini, Nizhoni Parlow R M.D on 07/29/2016 at 1:40 PM  Between 7am to 6pm - Pager - [phone]  After 6pm go to [email or web address] - password [email or web address] [email or web address]  [facility]  Office  [phone]  CC: Primary care physician; No PCP Per Patient  Note: This dictation was prepared with Dragon dictation along with smaller phrase technology. Any transcriptional errors that result from this process are unintentional.

## 2016-07-30 DIAGNOSIS — I5033 Acute on chronic diastolic (congestive) heart failure: Secondary | ICD-10-CM | POA: Diagnosis present

## 2016-07-30 DIAGNOSIS — R7989 Other specified abnormal findings of blood chemistry: Secondary | ICD-10-CM

## 2016-07-30 DIAGNOSIS — B370 Candidal stomatitis: Secondary | ICD-10-CM | POA: Diagnosis not present

## 2016-07-30 DIAGNOSIS — J189 Pneumonia, unspecified organism: Secondary | ICD-10-CM | POA: Diagnosis present

## 2016-07-30 DIAGNOSIS — J1100 Influenza due to unidentified influenza virus with unspecified type of pneumonia: Secondary | ICD-10-CM | POA: Diagnosis present

## 2016-07-30 DIAGNOSIS — R778 Other specified abnormalities of plasma proteins: Secondary | ICD-10-CM | POA: Diagnosis present

## 2016-07-30 DIAGNOSIS — J9601 Acute respiratory failure with hypoxia: Secondary | ICD-10-CM | POA: Diagnosis present

## 2016-07-30 LAB — GLUCOSE, CAPILLARY
GLUCOSE-CAPILLARY: 165 mg/dL — AB (ref 65–99)
GLUCOSE-CAPILLARY: 67 mg/dL (ref 65–99)
Glucose-Capillary: 267 mg/dL — ABNORMAL HIGH (ref 65–99)
Glucose-Capillary: 65 mg/dL (ref 65–99)
Glucose-Capillary: 93 mg/dL (ref 65–99)

## 2016-07-30 MED ORDER — PREDNISONE 10 MG (21) PO TBPK: 10.0000 mg | ORAL_TABLET | Freq: Every day | ORAL | 0 refills | Status: AC

## 2016-07-30 MED ORDER — NYSTATIN 100000 UNIT/ML MT SUSP: 5.0000 mL | Freq: Four times a day (QID) | OROMUCOSAL | 0 refills | Status: AC

## 2016-07-30 MED ORDER — ALBUTEROL SULFATE (2.5 MG/3ML) 0.083% IN NEBU: 2.5000 mg | INHALATION_SOLUTION | Freq: Four times a day (QID) | RESPIRATORY_TRACT | Status: DC

## 2016-07-30 MED ORDER — FUROSEMIDE 40 MG PO TABS: 20.0000 mg | ORAL_TABLET | Freq: Every day | ORAL | 0 refills | Status: AC

## 2016-07-30 MED ORDER — METOPROLOL TARTRATE 25 MG PO TABS: 12.5000 mg | ORAL_TABLET | Freq: Two times a day (BID) | ORAL | 0 refills | Status: AC

## 2016-07-30 NOTE — Progress Notes (Signed)
Physical Therapy Treatment Patient Details Name: Jo MangesMaria Rubey MRN: [id number] DOB: [birth year] Today's Date: 07/30/2016    History of Present Illness presented to ER secondary to SOB, generalized body aches and weakness; admitted with sepsis related to influenza, PNA.    PT Comments    Agrees to session.  Bed mobility with rail and encouragement but able to do without assist.  Stood and was able to ambulate with walker 40' without O2 before decreasing to 86% on room air.  Sat and O2 was replaced at 2 lpm.  Allowed to rest and with O2 she was able to ambulate 60' and sats remained at 92%.  Qualifying O2 sats were discussed and given to primary nurse for entry.  Pt generally steady with gait.  No LOB and good safety.  She does however need verbal cues to rest as she has the tendency to push herself even when fatigued.  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  Encouraged to use walker and O2 upon discharge.   Follow Up Recommendations  Home health PT     Equipment Recommendations       Recommendations for Other Services       Precautions / Restrictions Precautions Precautions: Fall Precaution Comments: Droplet iso Restrictions Weight Bearing Restrictions: No    Mobility  Bed Mobility Overal bed mobility: Modified Independent                Transfers Overall transfer level: Needs assistance Equipment used: Rolling walker (2 wheeled) Transfers: Sit to/from Stand Sit to Stand: Min guard            Ambulation/Gait Ambulation/Gait assistance: Min guard Ambulation Distance (Feet): 60 Feet Assistive device: Rolling walker (2 wheeled)     Gait velocity interpretation: at or above normal speed for age/gender General Gait Details: generally steady with in room ambulation and multiple turns.  Home O2 qualifying sats obtained and given to primary nurse.   Stairs            Wheelchair Mobility    Modified Rankin (Stroke Patients Only)        Balance Overall balance assessment: Needs assistance Sitting-balance support: Feet supported Sitting balance-Leahy Scale: Good     Standing balance support: Bilateral upper extremity supported Standing balance-Leahy Scale: Fair                      Cognition Arousal/Alertness: Awake/alert Behavior During Therapy: WFL for tasks assessed/performed Overall Cognitive Status: Within Functional Limits for tasks assessed                      Exercises      General Comments        Pertinent Vitals/Pain Pain Assessment: No/denies pain    Home Living                      Prior Function            PT Goals (current goals can now be found in the care plan section) Progress towards PT goals: Progressing toward goals    Frequency    Min 2X/week      PT Plan      Co-evaluation             End of Session Equipment Utilized During Treatment: Gait belt Activity Tolerance: Patient tolerated treatment well Patient left: in chair;with call bell/phone within reach;with chair alarm set;with family/visitor present     Time:  1610-9604 PT Time Calculation (min) (ACUTE ONLY): 32 min  Charges:  $Gait Training: 8-22 mins $Therapeutic Activity: 8-22 mins                    G Codes:      Danielle Dess 08-16-2016, 11:31 AM

## 2016-07-30 NOTE — Progress Notes (Signed)
Removed telemetry and PIV.  02 delivered to patient.  Escorted patient out of hospital via wheelchair.  Given Rx for 02 for airport and given information about diet.  No questions at this time

## 2016-07-30 NOTE — Progress Notes (Signed)
SATURATION QUALIFICATIONS: (This note is used to comply with regulatory documentation for home oxygen)  Patient Saturations on Room Air at Rest = 93%  Patient Saturations on Room Air while Ambulating = 86%  Patient Saturations on 2 Liters of oxygen while Ambulating = 92%  Please briefly explain why patient needs home oxygen: Influenza A, CHF

## 2016-07-30 NOTE — Care Management (Signed)
Updated daughter and primary nurse.  [facility] will deliver the portable concentrator to patient's hospital room. A regular concentrator will be delivered to Filomenia's home.  Instructed her to plug in patient's portable concentrator when arrive to the home in [place] so will be fully charged for trip tomorrow.  Provided the order for the oxygen to daughter in the event the airline requests it

## 2016-07-30 NOTE — Care Management (Signed)
Patient is for discharge today.  She will require oxygen and will be traveling by [employer] [place].  Was initially informed that patient could  be provided a portable concentrator to use on the airline but later informed this is not possible. [facility] can provide oxygen when patient arrives home but now is not able to provide oxygen inflight and portable tanks are not allowed.  Contacted [employer]. While on hold, was contacted by Angelica ChessmanMandy with [employer] and she had spoken with [place]- patient's daughter.  They discussed paying a deposit for the concentrator and Filomina's husband will be flying with patient and returning Monday.  He will bring the POC back.  So- [facility] will provide the oxygen for in flight after all

## 2016-07-30 NOTE — Care Management (Signed)
Daughter has been instructed to notify [facility] [employer] that patient will be traveling with oxygen

## 2016-08-03 NOTE — Discharge Summary (Signed)
[facility] - [place] at [facility]   PATIENT NAME: Jo Moreno    MR#:  [id number]  DATE OF BIRTH:  08-09-1924  DATE OF ADMISSION:  07/24/2016 ADMITTING PHYSICIAN: Auburn BilberryShreyang Patel, MD  DATE OF DISCHARGE: 07/30/2016  6:39 PM  PRIMARY CARE PHYSICIAN: No PCP Per Patient   ADMISSION DIAGNOSIS:  Influenza A [J10.1] Hypoxia [R09.02] SIRS (systemic inflammatory response syndrome) (HCC) [R65.10] Troponin I above reference range [R74.8] Bronchospasm, acute [J98.01]  DISCHARGE DIAGNOSIS:  Active Problems:   SOB (shortness of breath)   Acute respiratory failure with hypoxia (HCC)   Bilateral pneumonia   Acute on chronic diastolic CHF (congestive heart failure) (HCC)   Elevated troponin   Influenza with pneumonia   Oral thrush   SECONDARY DIAGNOSIS:   Past Medical History:  Diagnosis Date  . Arthritis   . DMII (diabetes mellitus, type 2) (HCC)   . Stroke [facility](HCC)      ADMITTING HISTORY  HISTORY OF PRESENT ILLNESS: Jo MangesMaria Greif  is a 83 y.o. female with a known history of  Osteoarthritis, diabetes type 2, CVA was currently visiting her [place] from [place] with oa ,dmii and previous CVA who is presenting with complaint of shortness of breath, generalized body aches and not feeling well. She also had nausea vomiting and diarrhea. She has had fevers as well. In the emergency room patient is noted to be hypoxic noticed to have a troponin that was elevated. She complains of chest pain with coughing.  HOSPITAL COURSE:   * Acute hypoxic respiratory failure due to influenza A pneumonitis and acute on chronic diastolic CHF Nebulizers as needed. Wean oxygen as tolerated. On 2 L oxygen today.  * Influenza A pneumonitis Treated with IV steroids, Tamiflu and scheduled nebulizer treatment. Patient is slowly improving.  Still needing to take this option by day of discharge which has been set up as outpatient by case management.  * Acute on chronic diastolic CHF Treated  with IV Lasix to start with. This caused mild elevation of creatinine. Later changed to oral Lasix. Patient is euvolemic by day of discharge.  * Diabetes mellitus type 2 Sliding scale insulin during hospital stay Resume to home medications at discharge  * mild elevation in troponin. Echocardiogram showed no wall motion abnormalities. Patient was seen by cardiology and thought to be demand ischemia. Not MI.  * DVT prophylaxis with Lovenox in the hospital  Patient has improved well. Continues to need oxygen. This has been set up as outpatient. Discharged home in stable condition.  CONSULTS OBTAINED:    DRUG ALLERGIES:   Allergies  Allergen Reactions  . 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    DISCHARGE MEDICATIONS:   Discharge Medication List as of 07/30/2016  4:02 PM    START taking these medications   Details  furosemide (LASIX) 40 MG tablet Take 0.5 tablets (20 mg total) by mouth daily., Starting Fri 07/31/2016, Normal    metoprolol tartrate (LOPRESSOR) 25 MG tablet Take 0.5 tablets (12.5 mg total) by mouth 2 (two) times daily., Starting Thu 07/30/2016, Normal    nystatin (MYCOSTATIN) 100000 UNIT/ML suspension Use as directed 5 mLs (500,000 Units total) in the mouth or throat 4 (four) times daily., Starting Thu 07/30/2016, Normal    predniSONE (STERAPRED UNI-PAK 21 TAB) 10 MG (21) TBPK tablet Take 1 tablet (10 mg total) by mouth daily. Day 1 60 mg. Taper 10 mg a day  for total 6 days, Starting Thu 07/30/2016, Normal      CONTINUE these medications which have NOT CHANGED   Details  acetaminophen (TYLENOL) 325 MG tablet Take 650 mg by mouth every 6 (six) hours as needed., Historical Med    atorvastatin (LIPITOR) 40 MG tablet Take 1 tablet (40 mg total) by mouth daily., Starting Tue 01/29/2015, Normal    esomeprazole (NEXIUM) 40 MG capsule Take 40 mg by mouth daily at 12 noon., Historical Med     hydrALAZINE (APRESOLINE) 25 MG tablet Take 1 tablet (25 mg total) by mouth 3 (three) times daily., Starting Wed 01/30/2015, Normal    metFORMIN (GLUCOPHAGE-XR) 500 MG 24 hr tablet Take 500 mg by mouth daily., Starting Thu 01/02/2016, Historical Med    PROAIR HFA 108 (90 BASE) MCG/ACT inhaler Inhale 2 puffs into the lungs 4 (four) times daily as needed., Historical Med    sertraline (ZOLOFT) 25 MG tablet Take 25 mg by mouth daily., Starting Thu 01/02/2016, Historical Med    vitamin B-12 (CYANOCOBALAMIN) 1000 MCG tablet Take 1,000 mcg by mouth daily., Historical Med    clopidogrel (PLAVIX) 75 MG tablet Take 1 tablet (75 mg total) by mouth daily., Starting Thu 07/26/2014, Print    ondansetron (ZOFRAN ODT) 4 MG disintegrating tablet Take 1 tablet (4 mg total) by mouth every 8 (eight) hours as needed for nausea or vomiting., Starting Thu 01/16/2016, Print        Today   VITAL SIGNS:  Blood pressure (!) 118/43, pulse 77, temperature 97.4 F (36.3 C), temperature source Oral, resp. rate (!) 22, height 5\' 2"  (1.575 m), weight 77.2 kg (170 lb 4.8 oz), SpO2 95 %.  I/O:  No intake or output data in the 24 hours ending 08/03/16 1639  PHYSICAL EXAMINATION:  Physical Exam  GENERAL:  83 y.o.-year-old patient lying in the bed with no acute distress.  LUNGS: mild expiratory wheezing CARDIOVASCULAR: S1, S2 normal. No murmurs, rubs, or gallops.  ABDOMEN: Soft, non-tender, non-distended. Bowel sounds present. No organomegaly or mass.  NEUROLOGIC: Moves all 4 extremities. PSYCHIATRIC: The patient is alert and oriented x 3.  SKIN: No obvious rash, lesion, or ulcer.   DATA REVIEW:   CBC  Recent Labs Lab 07/29/16 0436  WBC 9.3  HGB 12.0  HCT 35.5  PLT 208    Chemistries   Recent Labs Lab 07/28/16 0506  NA 137  K 4.5  CL 90*  CO2 40*  GLUCOSE 224*  BUN 36*  CREATININE 0.74  CALCIUM 8.4*    Cardiac Enzymes No results for input(s): TROPONINI in the last 168 hours.  Microbiology  Results  Results for orders placed or performed during the hospital encounter of 07/24/16  Culture, blood (Routine x 2)     Status: None   Collection Time: 07/24/16 11:15 AM  Result Value Ref Range Status   Specimen Description BLOOD LEFT ASSIST CONTROL  Final   Special Requests   Final    BOTTLES DRAWN AEROBIC AND ANAEROBIC 10MLAERO,12MLANA   Culture NO GROWTH 5 DAYS  Final   Report Status 07/29/2016 FINAL  Final  Culture, blood (Routine x 2)     Status: None   Collection Time: 07/24/16 11:19 AM  Result Value Ref Range Status   Specimen Description BLOOD RIGHT ASSIST CONTROL  Final   Special Requests   Final    BOTTLES DRAWN AEROBIC AND ANAEROBIC 9MLAERO,10MLANA   Culture NO GROWTH 5 DAYS  Final   Report Status 07/29/2016 FINAL  Final  Urine culture     Status: None   Collection Time: 07/24/16 11:36 AM  Result Value Ref Range Status   Specimen Description URINE, RANDOM  Final   Special Requests NONE  Final   Culture   Final    NO GROWTH Performed at [facility], [address], [place], [place] [address]    Report Status 07/26/2016 FINAL  Final  Culture, sputum-assessment     Status: None   Collection Time: 07/25/16  5:45 AM  Result Value Ref Range Status   Specimen Description EXPECTORATED SPUTUM  Final   Special Requests NONE  Final   Sputum evaluation THIS SPECIMEN IS ACCEPTABLE FOR SPUTUM CULTURE  Final   Report Status 07/25/2016 FINAL  Final  Culture, respiratory (NON-Expectorated)     Status: None   Collection Time: 07/25/16  5:45 AM  Result Value Ref Range Status   Specimen Description EXPECTORATED SPUTUM  Final   Special Requests NONE Reflexed from [id number]  Final   Gram Stain   Final    ABUNDANT WBC PRESENT,BOTH PMN AND MONONUCLEAR MODERATE GRAM POSITIVE COCCI IN PAIRS FEW GRAM VARIABLE ROD    Culture   Final    Consistent with normal respiratory flora. Performed at [facility], [address] [place], [place] [address]    Report Status  07/27/2016 FINAL  Final    RADIOLOGY:  No results found.  Follow up with PCP in 1 week.  Management plans discussed with the patient, family and they are in agreement.  CODE STATUS:  Code Status History    Date Active Date Inactive Code Status Order ID Comments User Context   07/24/2016  8:26 PM 07/30/2016  9:39 PM Full Code [id number]  Auburn Bilberry, MD Inpatient   01/25/2015  3:03 PM 01/30/2015  2:41 PM Full Code [id number]  Ramonita Lab, MD Inpatient      TOTAL TIME TAKING CARE OF THIS PATIENT ON DAY OF DISCHARGE: more than 30 minutes.   Milagros Loll R M.D on 08/03/2016 at 4:39 PM  Between 7am to 6pm - Pager - [phone]  After 6pm go to [email or web address] - password [email or web address] [facility]  [facility]  Office  [phone]  CC: Primary care physician; No PCP Per Patient  Note: This dictation was prepared with Dragon dictation along with smaller phrase technology. Any transcriptional errors that result from this process are unintentional.

## 2017-04-17 IMAGING — CT CT HEAD W/O CM
3 series · 16 of 47 positions shown, 19 images · non-contrast
Comparison: 01/25/2015

CLINICAL DATA: Chronic headaches

EXAM:
CT HEAD WITHOUT CONTRAST
TECHNIQUE: Contiguous axial images were obtained from the base of the skull
through the vertex without intravenous contrast.

[Series 2: head wo · axial · 0.42mm/px · z∈[-128,-4]mm · 10 of 31 slices shown, 13 images]
[im 3/31  brain]
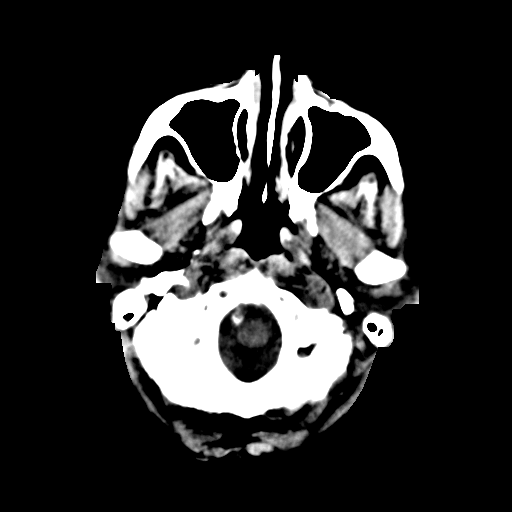
[im 3/31  bone]
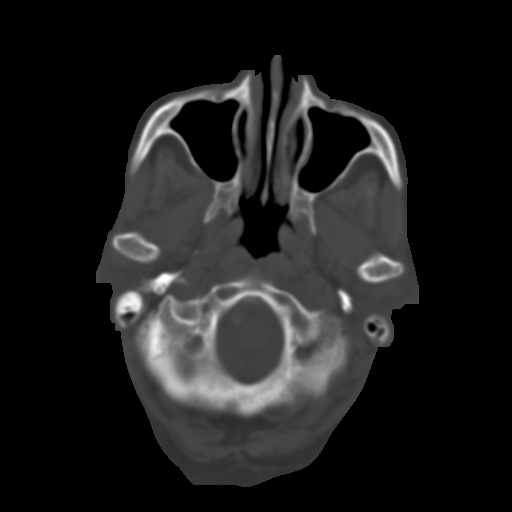
[im 6/31  brain]
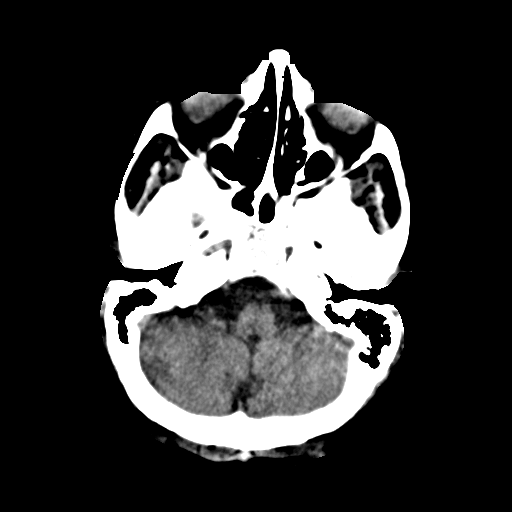
[im 9/31  brain]
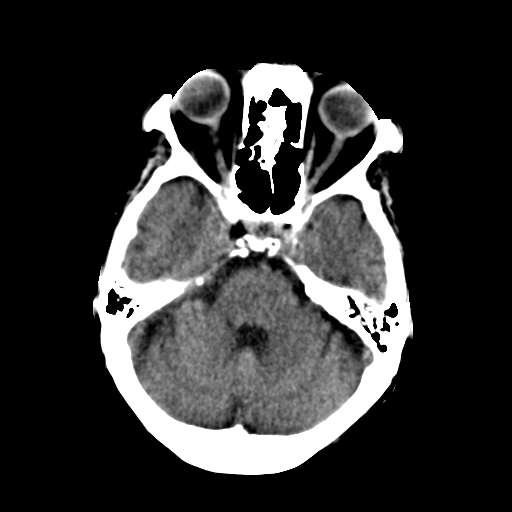
[im 11/31  brain]
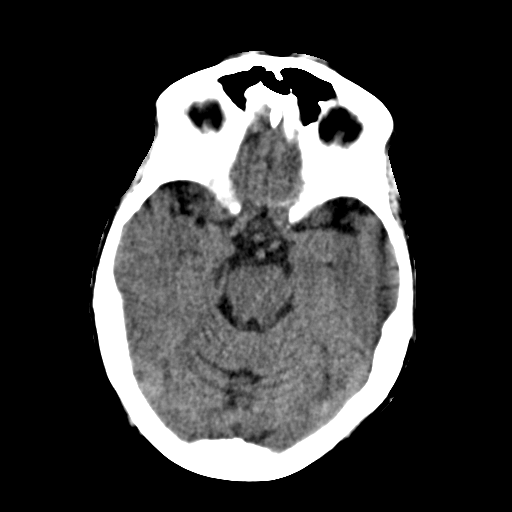
[im 14/31  brain]
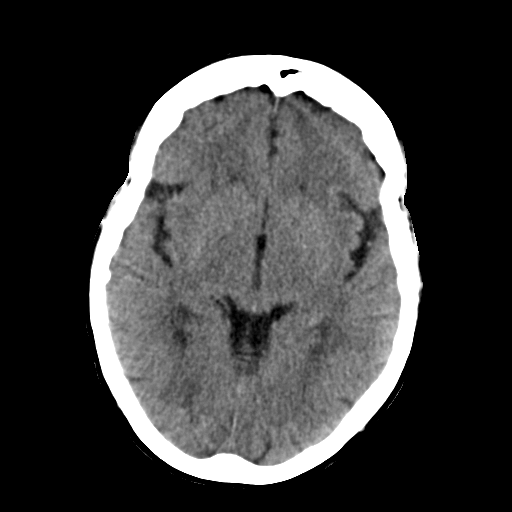
[im 14/31  bone]
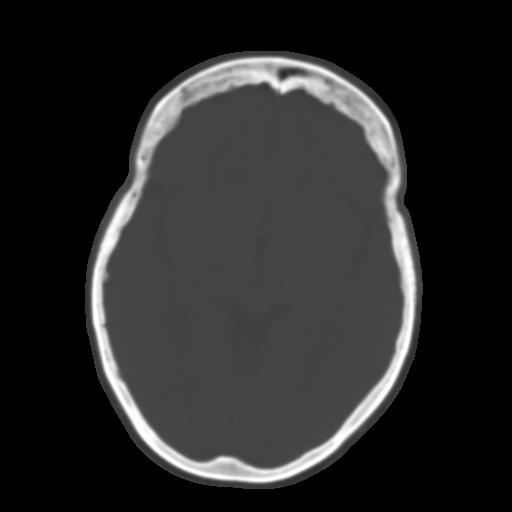
[im 17/31  brain]
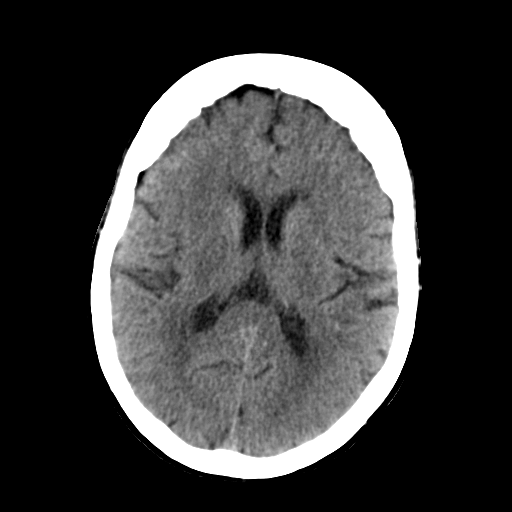
[im 20/31  brain]
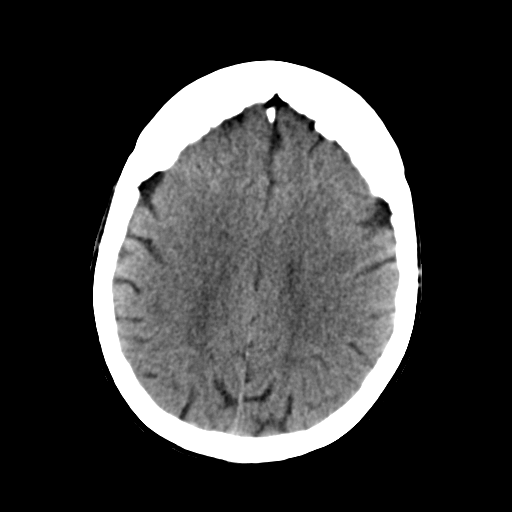
[im 23/31  brain]
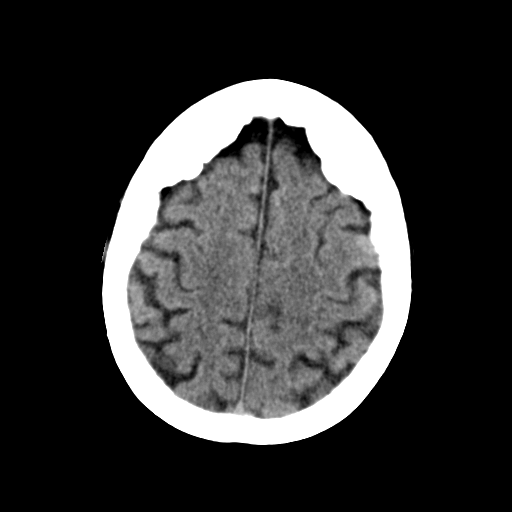
[im 25/31  brain]
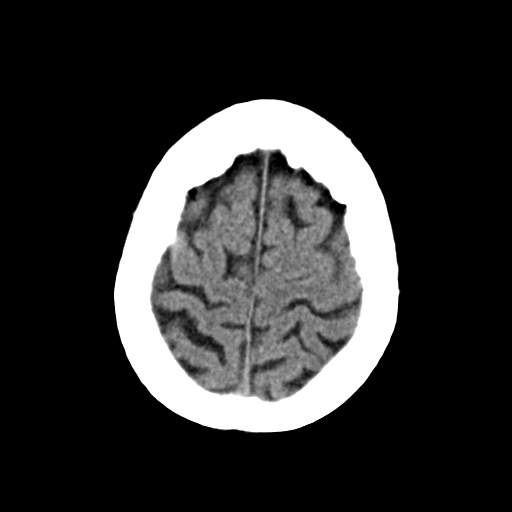
[im 25/31  bone]
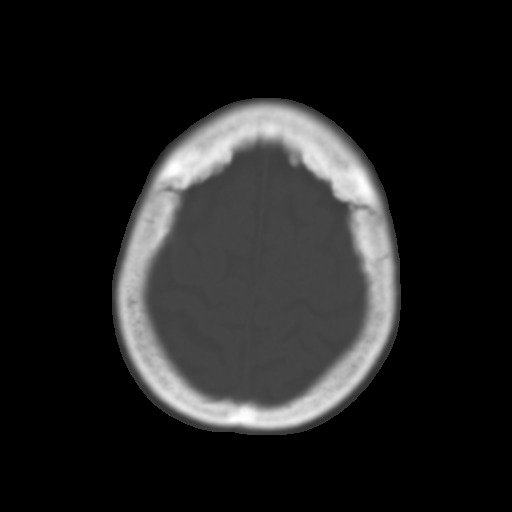
[im 28/31  brain]
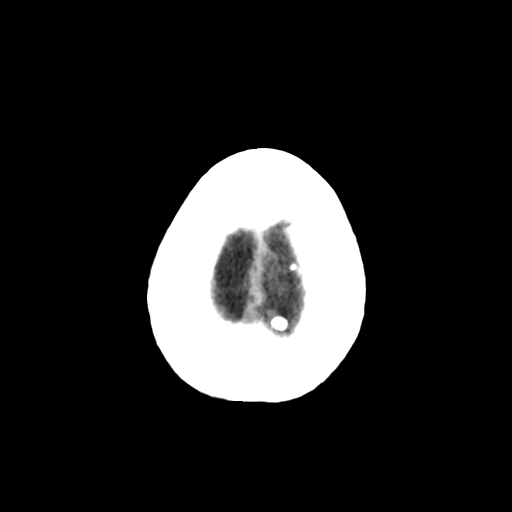

[Series 4: coronal soft · coronal · 0.31mm/px · 3 of 61 slices shown]
[im 21/61  brain]
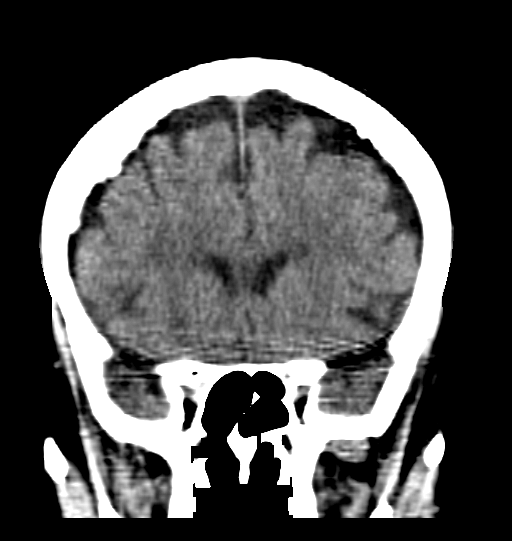
[im 27/61  brain]
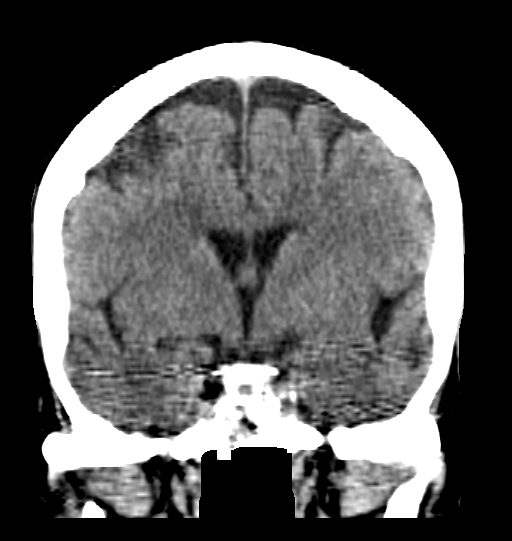
[im 34/61  brain]
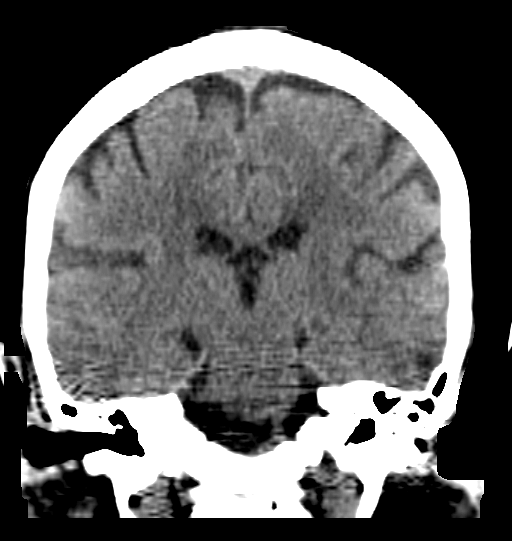

[Series 5: sagittal soft · sagittal · 0.31mm/px · 3 of 49 slices shown]
[im 17/49  brain]
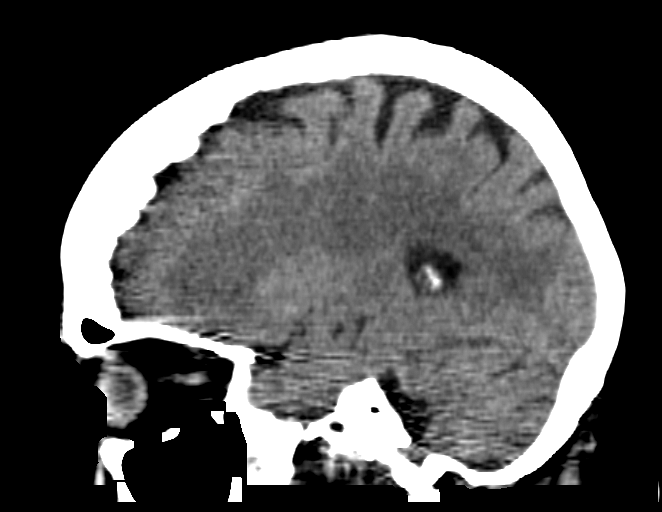
[im 25/49  brain]
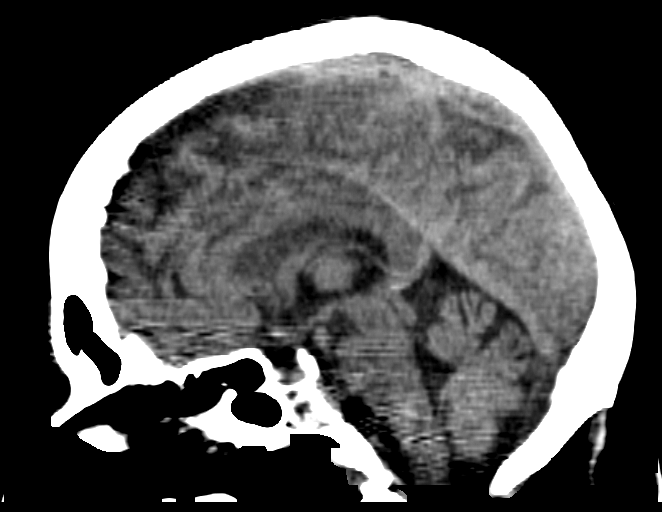
[im 33/49  brain]
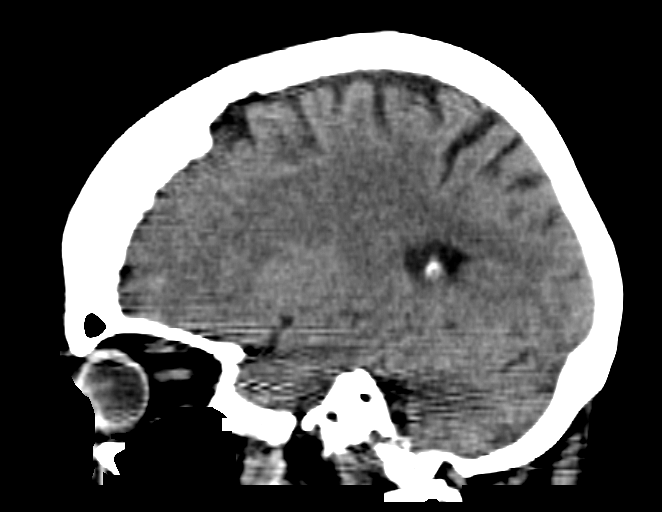

[16 of 47 positions shown; findings below may reference images not displayed]

FINDINGS: The bony calvarium is intact. No gross soft tissue abnormality is
noted. Mild atrophic and chronic white matter ischemic changes are
again seen. No findings to suggest acute hemorrhage, acute
infarction or space-occupying mass lesion are noted.
IMPRESSION: Chronic atrophic and ischemic changes.  No acute abnormality noted.

## 2017-10-25 IMAGING — CR DG CHEST 2V
1 series · 2 of 2 positions shown · non-contrast
Comparison: Radiographs yesterday.

CLINICAL DATA: Shortness of breath.  Diagnosed with flu yesterday.

EXAM:
CHEST  2 VIEW

[Series 1: dg chest 2 view · 0.14mm/px · 2 of 2 slices shown]
[im 1/2]
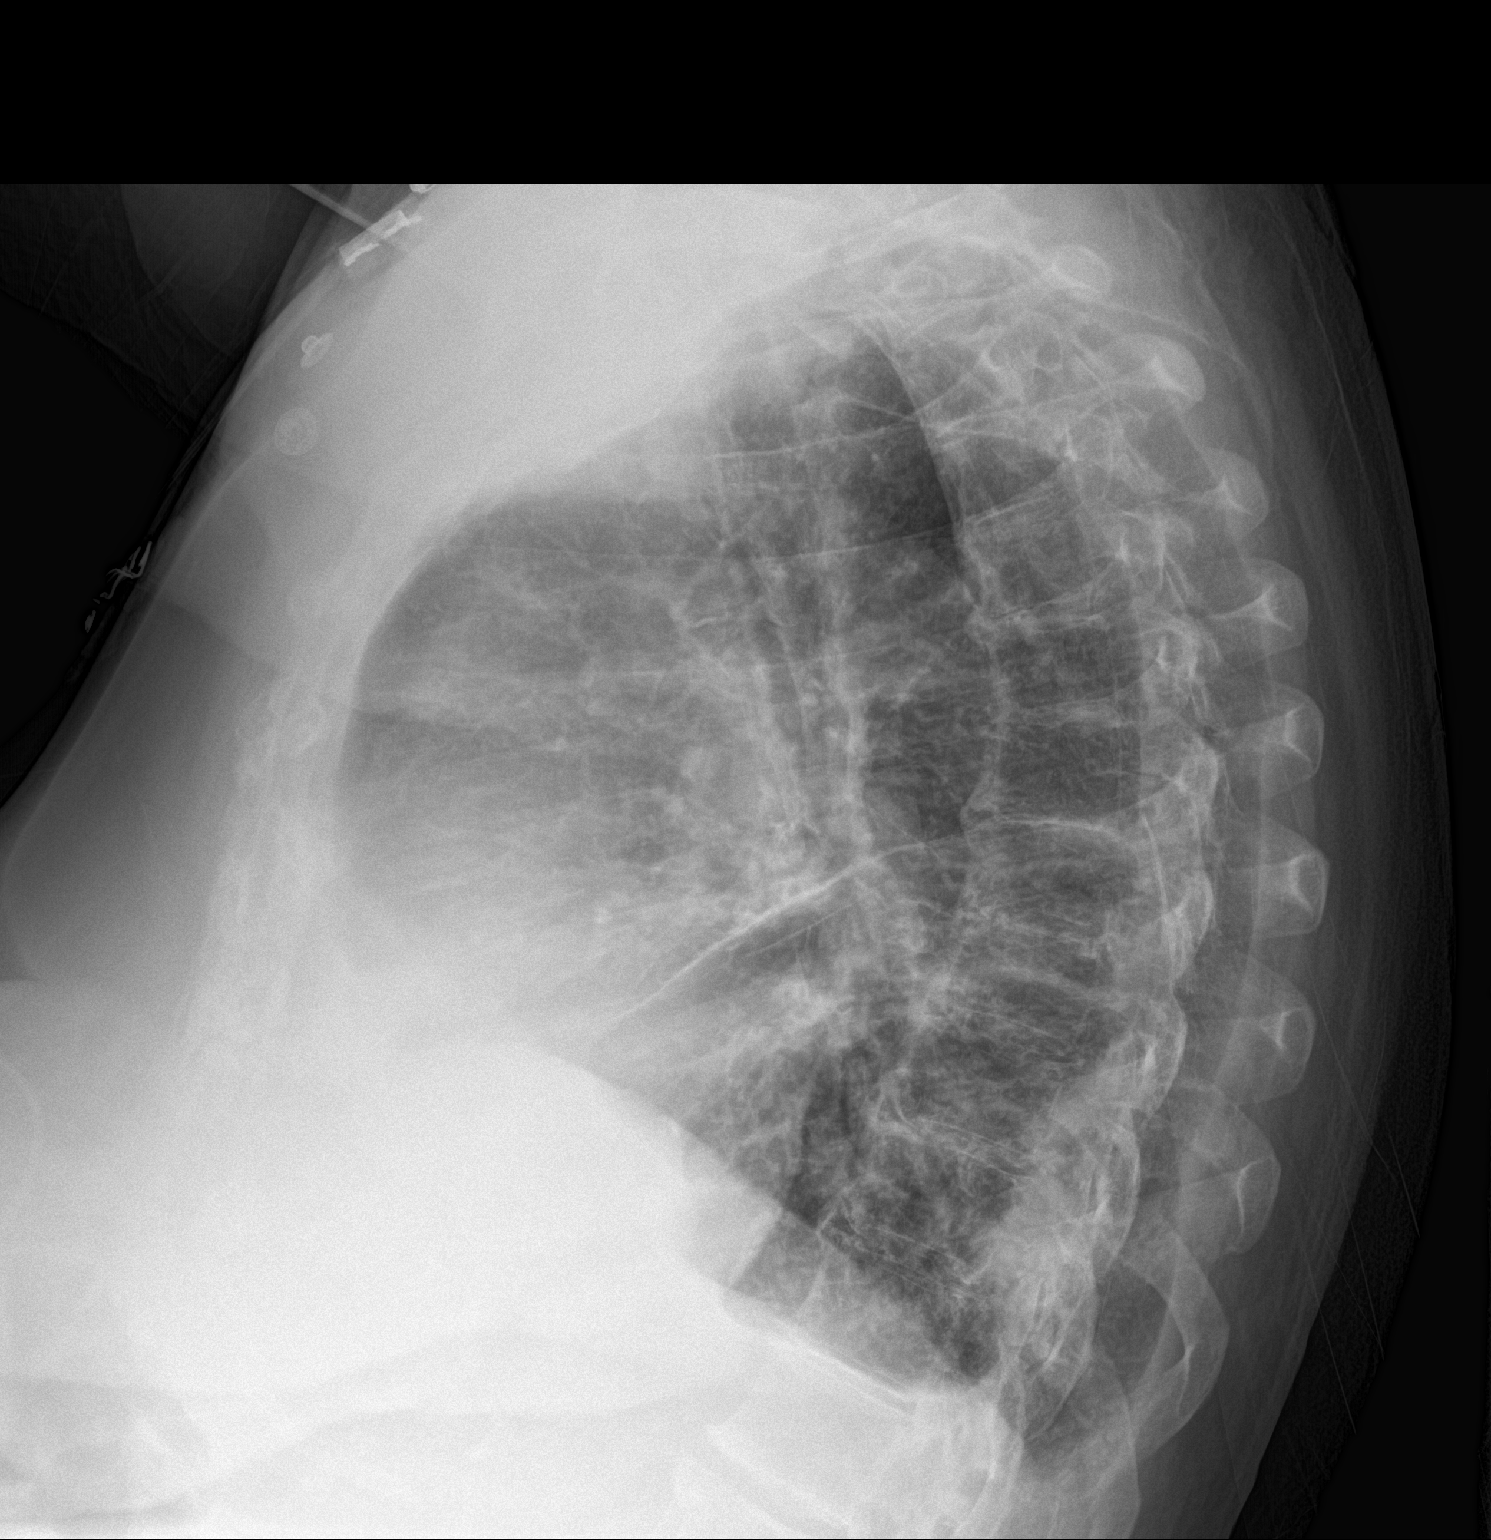
[im 2/2]
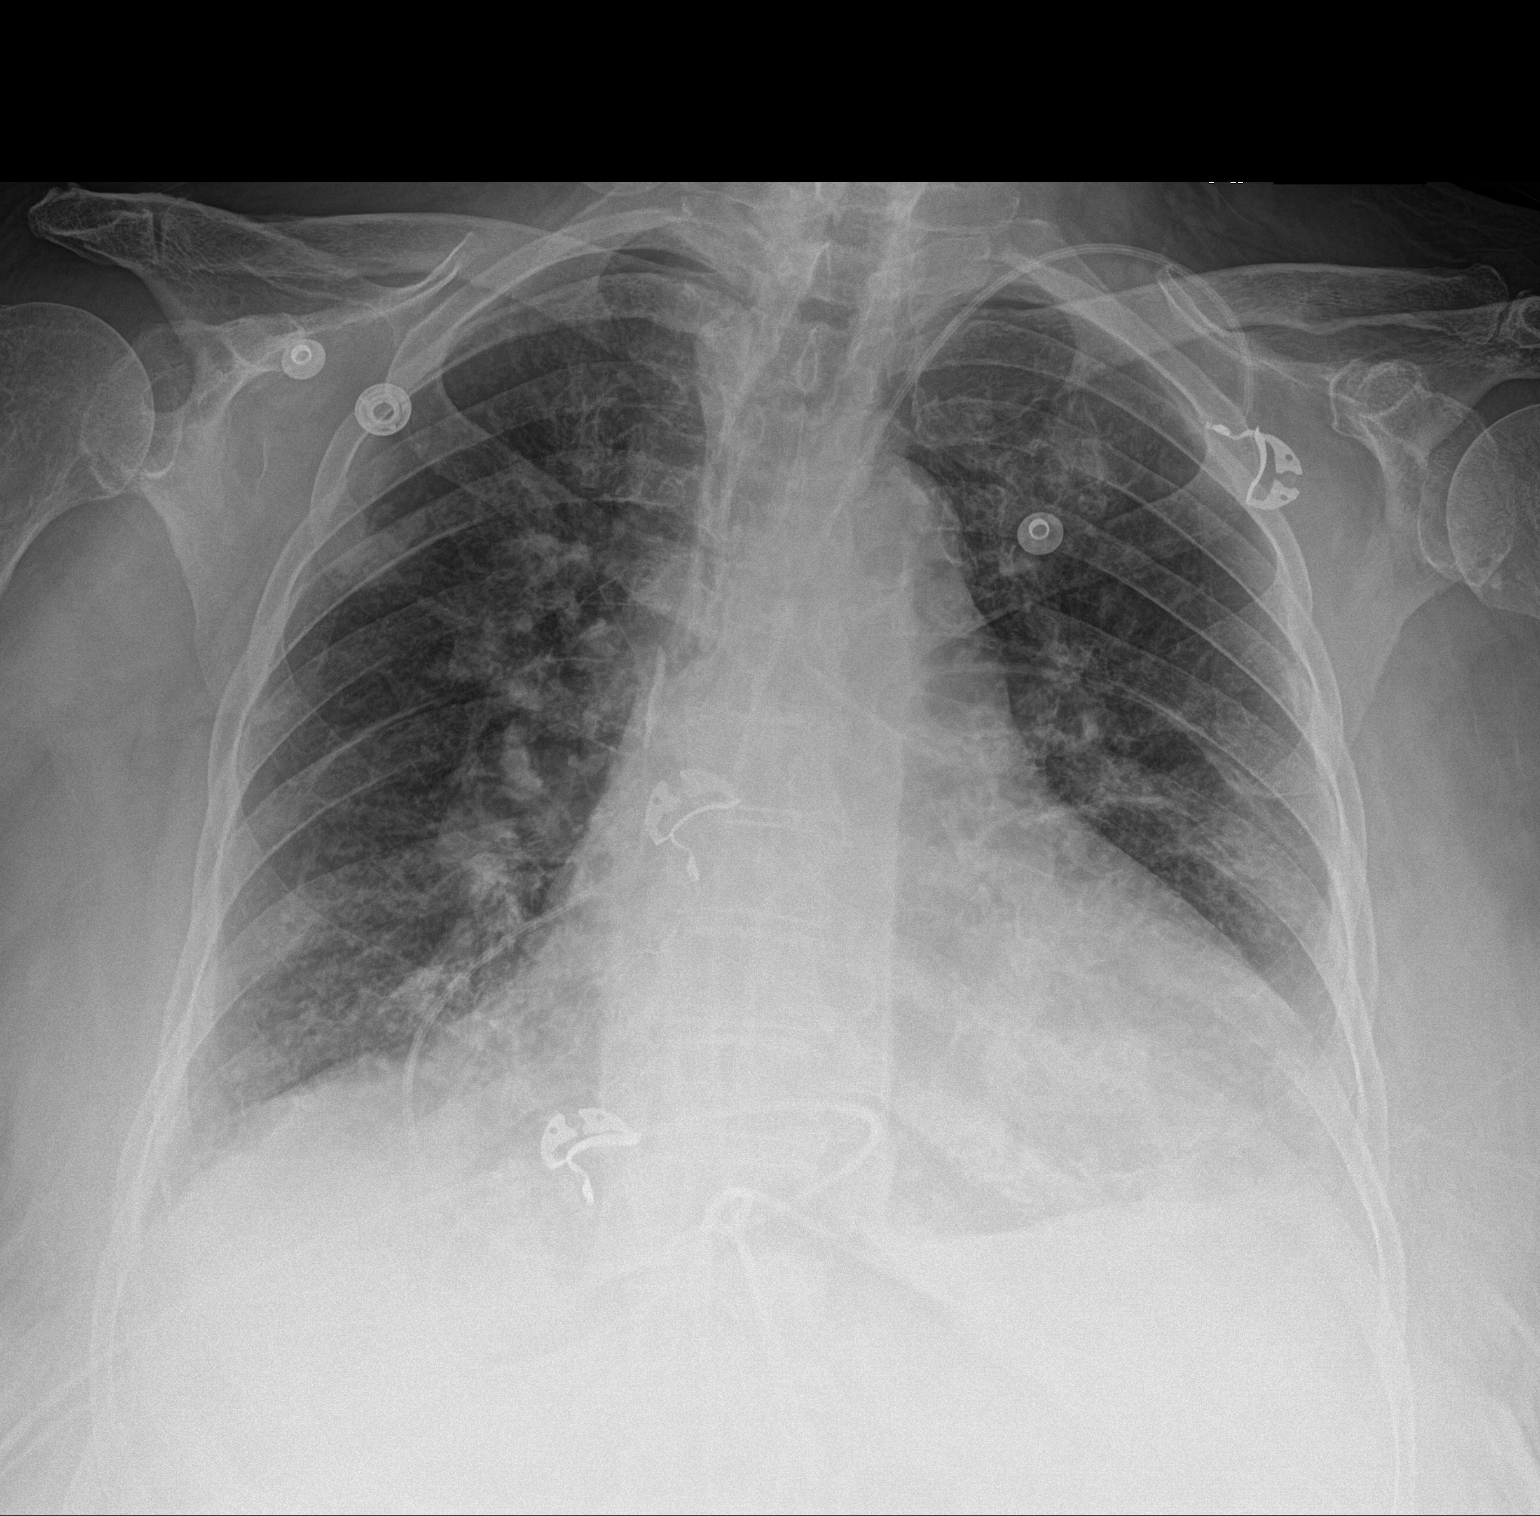

[2 of 2 positions shown; findings below may reference images not displayed]

FINDINGS: Stable cardiomegaly and mediastinal contours. Small left pleural
effusion and fluid in the fissures. Worsening vascular congestion
with new peribronchial thickening. No confluent airspace disease. No
pneumothorax. Overlying monitoring devices project over the thorax.
IMPRESSION: Worsening vascular congestion. New peribronchial thickening may be
pulmonary edema or infectious/inflammatory.

Cardiomegaly and left pleural effusion are unchanged.

## 2019-04-06 DEATH — deceased
